# Patient Record
Sex: Male | Born: 1950 | Race: White | Hispanic: No | Marital: Married | Smoking: Current every day smoker
Health system: Southern US, Community
[De-identification: ages and names within clinical notes are randomized; demographics above are authoritative.]

## PROBLEM LIST (undated history)

## (undated) DIAGNOSIS — E119 Type 2 diabetes mellitus without complications: Secondary | ICD-10-CM

## (undated) DIAGNOSIS — Z515 Encounter for palliative care: Secondary | ICD-10-CM

## (undated) DIAGNOSIS — J449 Chronic obstructive pulmonary disease, unspecified: Secondary | ICD-10-CM

## (undated) DIAGNOSIS — C801 Malignant (primary) neoplasm, unspecified: Secondary | ICD-10-CM

---

## 2000-01-12 ENCOUNTER — Encounter: Payer: Self-pay | Admitting: Emergency Medicine

## 2000-01-12 ENCOUNTER — Emergency Department (HOSPITAL_COMMUNITY): Admission: EM | Admit: 2000-01-12 | Discharge: 2000-01-12 | Payer: Self-pay | Admitting: Emergency Medicine

## 2000-05-11 ENCOUNTER — Encounter: Payer: Self-pay | Admitting: Emergency Medicine

## 2000-05-11 ENCOUNTER — Inpatient Hospital Stay (HOSPITAL_COMMUNITY): Admission: EM | Admit: 2000-05-11 | Discharge: 2000-05-13 | Payer: Self-pay | Admitting: Emergency Medicine

## 2000-05-12 ENCOUNTER — Encounter: Payer: Self-pay | Admitting: Internal Medicine

## 2000-05-22 ENCOUNTER — Emergency Department (HOSPITAL_COMMUNITY): Admission: EM | Admit: 2000-05-22 | Discharge: 2000-05-22 | Payer: Self-pay | Admitting: Emergency Medicine

## 2000-05-26 ENCOUNTER — Ambulatory Visit (HOSPITAL_BASED_OUTPATIENT_CLINIC_OR_DEPARTMENT_OTHER): Admission: RE | Admit: 2000-05-26 | Discharge: 2000-05-26 | Payer: Self-pay | Admitting: *Deleted

## 2005-12-26 ENCOUNTER — Ambulatory Visit (HOSPITAL_BASED_OUTPATIENT_CLINIC_OR_DEPARTMENT_OTHER): Admission: RE | Admit: 2005-12-26 | Discharge: 2005-12-26 | Payer: Self-pay | Admitting: Family Medicine

## 2010-02-27 ENCOUNTER — Other Ambulatory Visit: Payer: Self-pay | Admitting: Urology

## 2010-02-27 DIAGNOSIS — R310 Gross hematuria: Secondary | ICD-10-CM

## 2010-03-02 ENCOUNTER — Ambulatory Visit
Admission: RE | Admit: 2010-03-02 | Discharge: 2010-03-02 | Disposition: A | Discharge status: Home / Self Care | Payer: No Typology Code available for payment source | Source: Ambulatory Visit | Attending: Urology | Admitting: Urology

## 2010-03-02 DIAGNOSIS — R310 Gross hematuria: Secondary | ICD-10-CM

## 2010-03-02 MED ORDER — IOHEXOL 300 MG/ML  SOLN
150.0000 mL | Freq: Once | INTRAMUSCULAR | Status: AC | PRN
  Administered 2010-03-02: 150 mL via INTRAVENOUS

## 2010-03-19 ENCOUNTER — Other Ambulatory Visit: Payer: Self-pay | Admitting: Urology

## 2010-03-19 ENCOUNTER — Ambulatory Visit (HOSPITAL_COMMUNITY)
Admission: RE | Admit: 2010-03-19 | Discharge: 2010-03-19 | Disposition: A | Discharge status: Home / Self Care | Payer: Medicare Other | Source: Ambulatory Visit | Attending: Urology | Admitting: Urology

## 2010-03-19 ENCOUNTER — Encounter (HOSPITAL_COMMUNITY): Payer: Medicare Other

## 2010-03-19 ENCOUNTER — Other Ambulatory Visit (HOSPITAL_COMMUNITY): Payer: Self-pay | Admitting: Urology

## 2010-03-19 DIAGNOSIS — I4949 Other premature depolarization: Secondary | ICD-10-CM | POA: Insufficient documentation

## 2010-03-19 DIAGNOSIS — Z01812 Encounter for preprocedural laboratory examination: Secondary | ICD-10-CM | POA: Insufficient documentation

## 2010-03-19 DIAGNOSIS — Z01818 Encounter for other preprocedural examination: Secondary | ICD-10-CM | POA: Insufficient documentation

## 2010-03-19 DIAGNOSIS — Z01810 Encounter for preprocedural cardiovascular examination: Secondary | ICD-10-CM | POA: Insufficient documentation

## 2010-03-19 LAB — BASIC METABOLIC PANEL
BUN: 10 mg/dL (ref 6–23)
CO2: 29 mEq/L (ref 19–32)
Calcium: 9.1 mg/dL (ref 8.4–10.5)
Chloride: 100 mEq/L (ref 96–112)
Creatinine, Ser: 0.81 mg/dL (ref 0.4–1.5)
GFR calc Af Amer: >60 mL/min (ref >60)
GFR calc non Af Amer: >60 mL/min (ref >60)
Glucose, Bld: 131 mg/dL — ABNORMAL HIGH (ref 70–99)
Potassium: 3.9 mEq/L (ref 3.5–5.1)
Sodium: 137 mEq/L (ref 135–145)

## 2010-03-19 LAB — CBC
HCT: 44.2 % (ref 39.0–52.0)
Hemoglobin: 14.6 g/dL (ref 13.0–17.0)
MCH: 28.1 pg (ref 26.0–34.0)
MCHC: 33 g/dL (ref 30.0–36.0)
MCV: 85.2 fL (ref 78.0–100.0)
Platelets: 243 10*3/uL (ref 150–400)
RBC: 5.19 MIL/uL (ref 4.22–5.81)
RDW: 14.1 % (ref 11.5–15.5)
WBC: 13.9 10*3/uL — ABNORMAL HIGH (ref 4.0–10.5)

## 2010-03-19 LAB — SURGICAL PCR SCREEN
MRSA, PCR: NEGATIVE
Staphylococcus aureus: NEGATIVE

## 2010-03-23 ENCOUNTER — Other Ambulatory Visit: Payer: Self-pay | Admitting: Urology

## 2010-03-23 ENCOUNTER — Ambulatory Visit (HOSPITAL_COMMUNITY)
Admission: RE | Admit: 2010-03-23 | Discharge: 2010-03-23 | Disposition: A | Discharge status: Home / Self Care | Payer: Medicare Other | Source: Ambulatory Visit | Attending: Urology | Admitting: Urology

## 2010-03-23 DIAGNOSIS — D090 Carcinoma in situ of bladder: Secondary | ICD-10-CM | POA: Insufficient documentation

## 2010-03-23 DIAGNOSIS — G4733 Obstructive sleep apnea (adult) (pediatric): Secondary | ICD-10-CM | POA: Insufficient documentation

## 2010-03-23 DIAGNOSIS — Z79899 Other long term (current) drug therapy: Secondary | ICD-10-CM | POA: Insufficient documentation

## 2010-03-23 DIAGNOSIS — N35919 Unspecified urethral stricture, male, unspecified site: Secondary | ICD-10-CM | POA: Insufficient documentation

## 2010-03-23 DIAGNOSIS — K219 Gastro-esophageal reflux disease without esophagitis: Secondary | ICD-10-CM | POA: Insufficient documentation

## 2010-03-23 NOTE — Op Note (Signed)
[name], [name] NO.:  [id number]  MEDICAL RECORD NO.:  [id number]           PATIENT TYPE:  O  LOCATION:  DAYL                         FACILITY:  [facility]  PHYSICIAN:  Yolander Goodie C. Vernie Ammons, M.D.  DATE OF BIRTH:  07-11-1950  DATE OF PROCEDURE:  03/23/2010 DATE OF DISCHARGE:                              OPERATIVE REPORT   PREOPERATIVE DIAGNOSIS:  Bladder tumor.  POSTOPERATIVE DIAGNOSIS:  1. Bladder tumor.                           2. Fossa navicularis stricture/stenosis.   PROCEDURE: 1. Transurethral resection of bladder tumor ( 2.6cm). 2. Bladder biopsy. 3. Urethral dilatation.  SURGEON:  Rachid Parham C. Vernie Ammons, M.D.  ANESTHESIA:  General.  DRAINS:  A 24-French Foley catheter.  BLOOD LOSS:  Minimal.  SPECIMENS: 1. Cold cup biopsy of trigone. 2. Bladder tumor.  COMPLICATIONS:  None.  INDICATIONS:  The patient is a 60 year old male who experienced gross hematuria.  He was evaluated with upper tract studies which revealed no abnormality.  Cystoscopically, I noted a bladder tumor on the left wall of his bladder and we therefore discussed treatment options and he has elected to proceed with transurethral resection of his bladder tumor. The risks, complications and alternatives have been discussed.  DESCRIPTION OF OPERATION:  After informed consent, the patient was brought to the major OR, placed on the table and administered general anesthesia.  The case was exceedingly difficult due to his massive size which necessitated strapping his large panniculus in a cephalad direction in order to gain access to the genitalia.  The genitalia was then sterilely prepped and draped and an official time-out was then performed.  Latex precautions were used as well.  Initially I attempted to pass the 26-French resectoscope but was unable due to stenosis in the area of the fossa navicularis.  I therefore used [employer] sounds to dilate this area up to 30-French.  I then was  able to pass the 26-French resectoscope with [employer] obturator into the bladder, removing the obturator I inserted the 12-degree lens with resectoscope element.  I first noted the bladder appeared to be lined with normal-appearing pink mucosa.  Ureteral orifices were of normal configuration and position.  The bladder tumor was identified on the left wall of the bladder lateral to the left ureteral orifice.  It did not appear to involve the orifice.  A full and systematic inspection of the bladder was undertaken.  Pressure on the bladder could not be performed due to his body habitus and therefore I removed the 12-degrees lens and inserted the 70-degrees lens which afforded me the ability to visualize the dome and anterior wall of the bladder as well as bladder neck region.  No other bladder tumors were identified other than the single tumor seen previously.  The 12-degrees lens with resectoscope element was then reinserted and I resected the bladder tumor.  I then removed all bladder tumor portions from the bladder using the [facility] syringe.  I then inserted the cold cup biopsy forceps and obtained cold cup biopsy from  the trigone just medial to the ureteral orifice.  This was sent as a separate specimen.  I then reinserted the resectoscope element and fulgurated this location as well as made sure there was no bleeding from the area of resection. Visualization revealed no bladder perforation.  I therefore drained the bladder and removed the resectoscope.  The urethral dilatation did result in bleeding from the fossa navicularis region.  I therefore placed at 24-French Foley catheter which allowed this area to be tamponaded and controlled any further bleeding.  The patient was then awakened and taken to recovery room in stable and satisfactory condition.  He tolerated the procedure well. There were no intraoperative complications.  He will be given written discharge instructions as  well as followup appointment and return to discuss his pathology results.     [name] C. Vernie Ammons, M.D.     MCO/MEDQ  D:  03/23/2010  T:  03/23/2010  Job:  [id number]  Electronically Signed by Ihor Gully M.D. on 03/23/2010 01:50:34 PM

## 2012-02-23 IMAGING — CR DG CHEST 2V
2 series · 2 of 2 positions shown · non-contrast
Comparison: None.

CLINICAL DATA: 59-year-old male preoperative study for bladder
tumor.

CHEST - 2 VIEW

[w chest pa *]
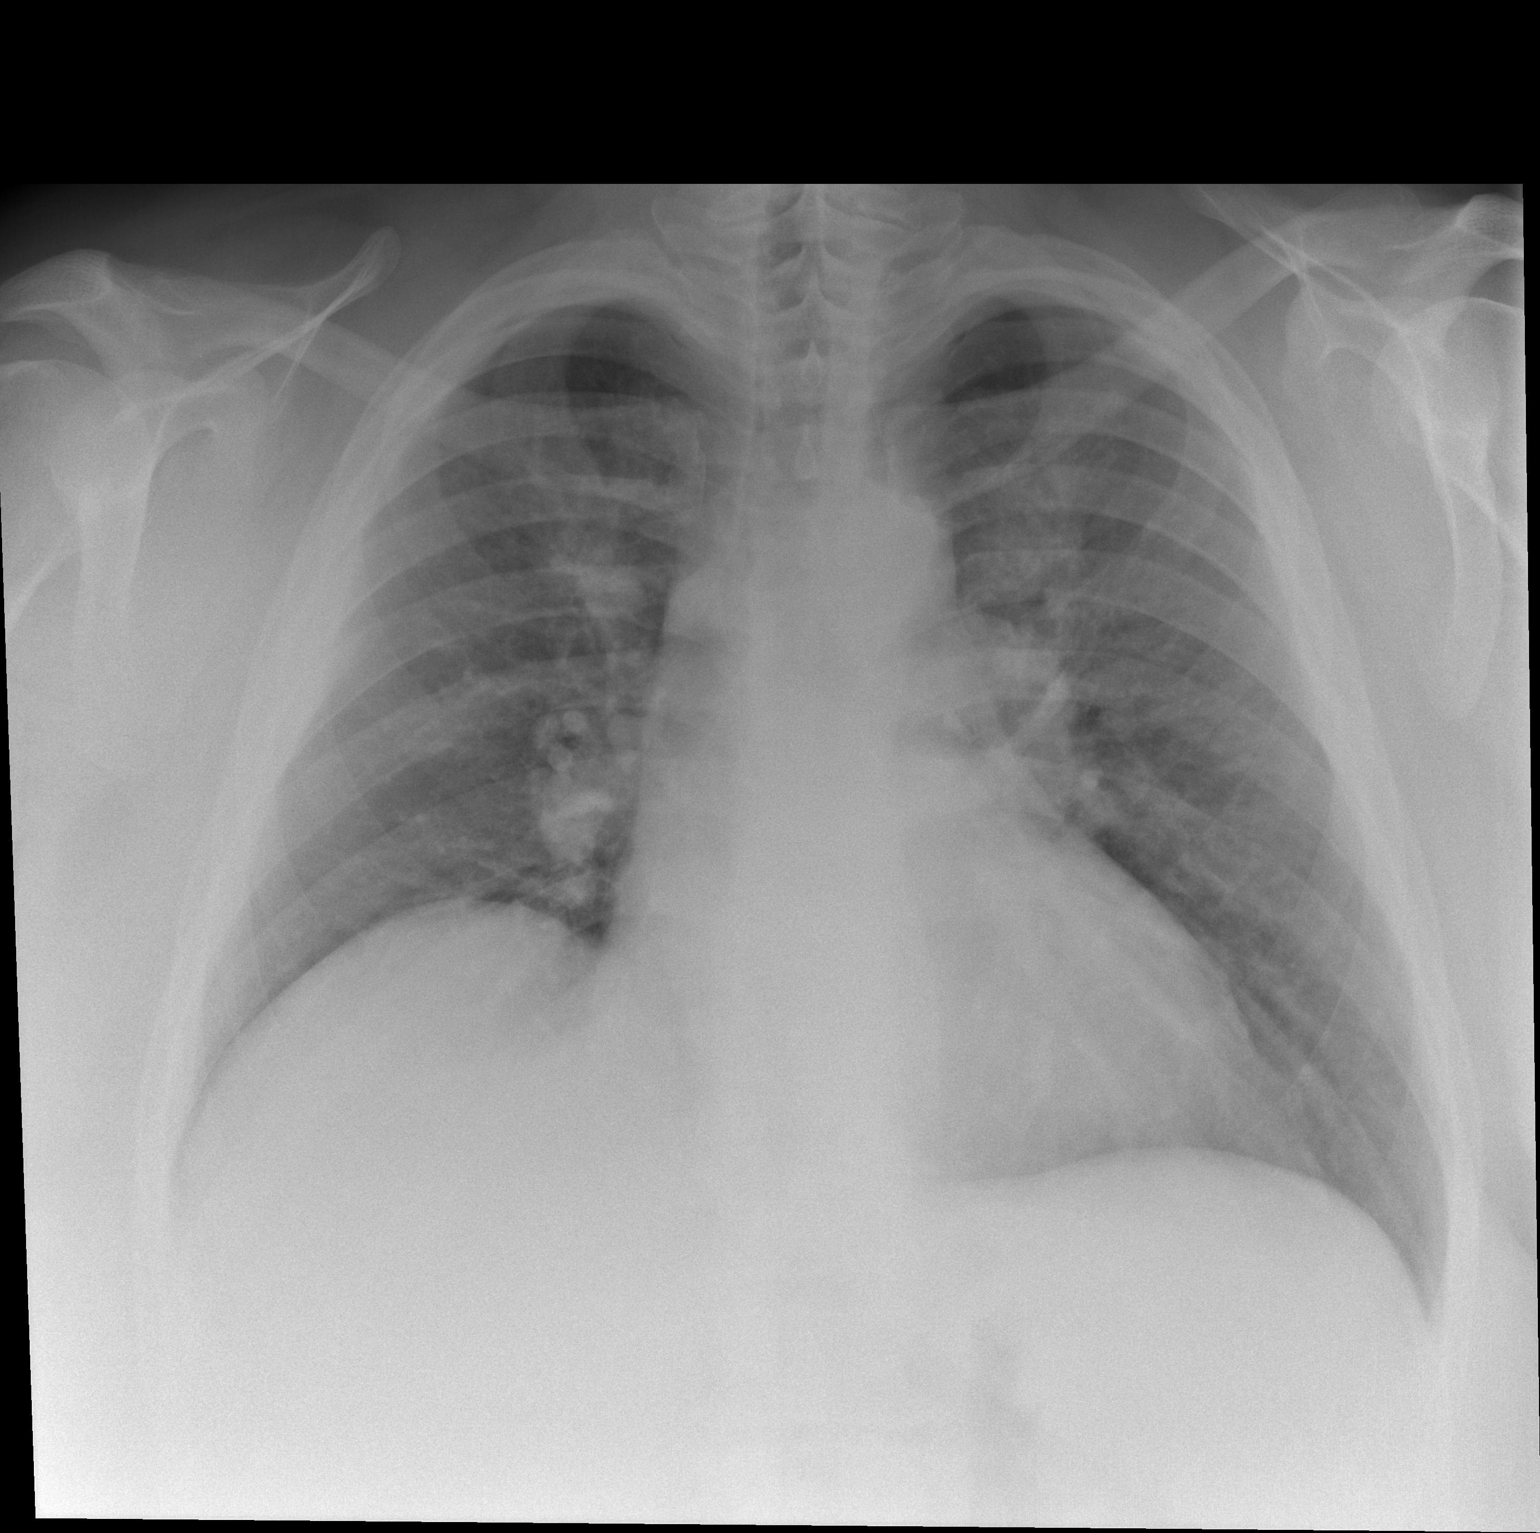

[w chest lat *]
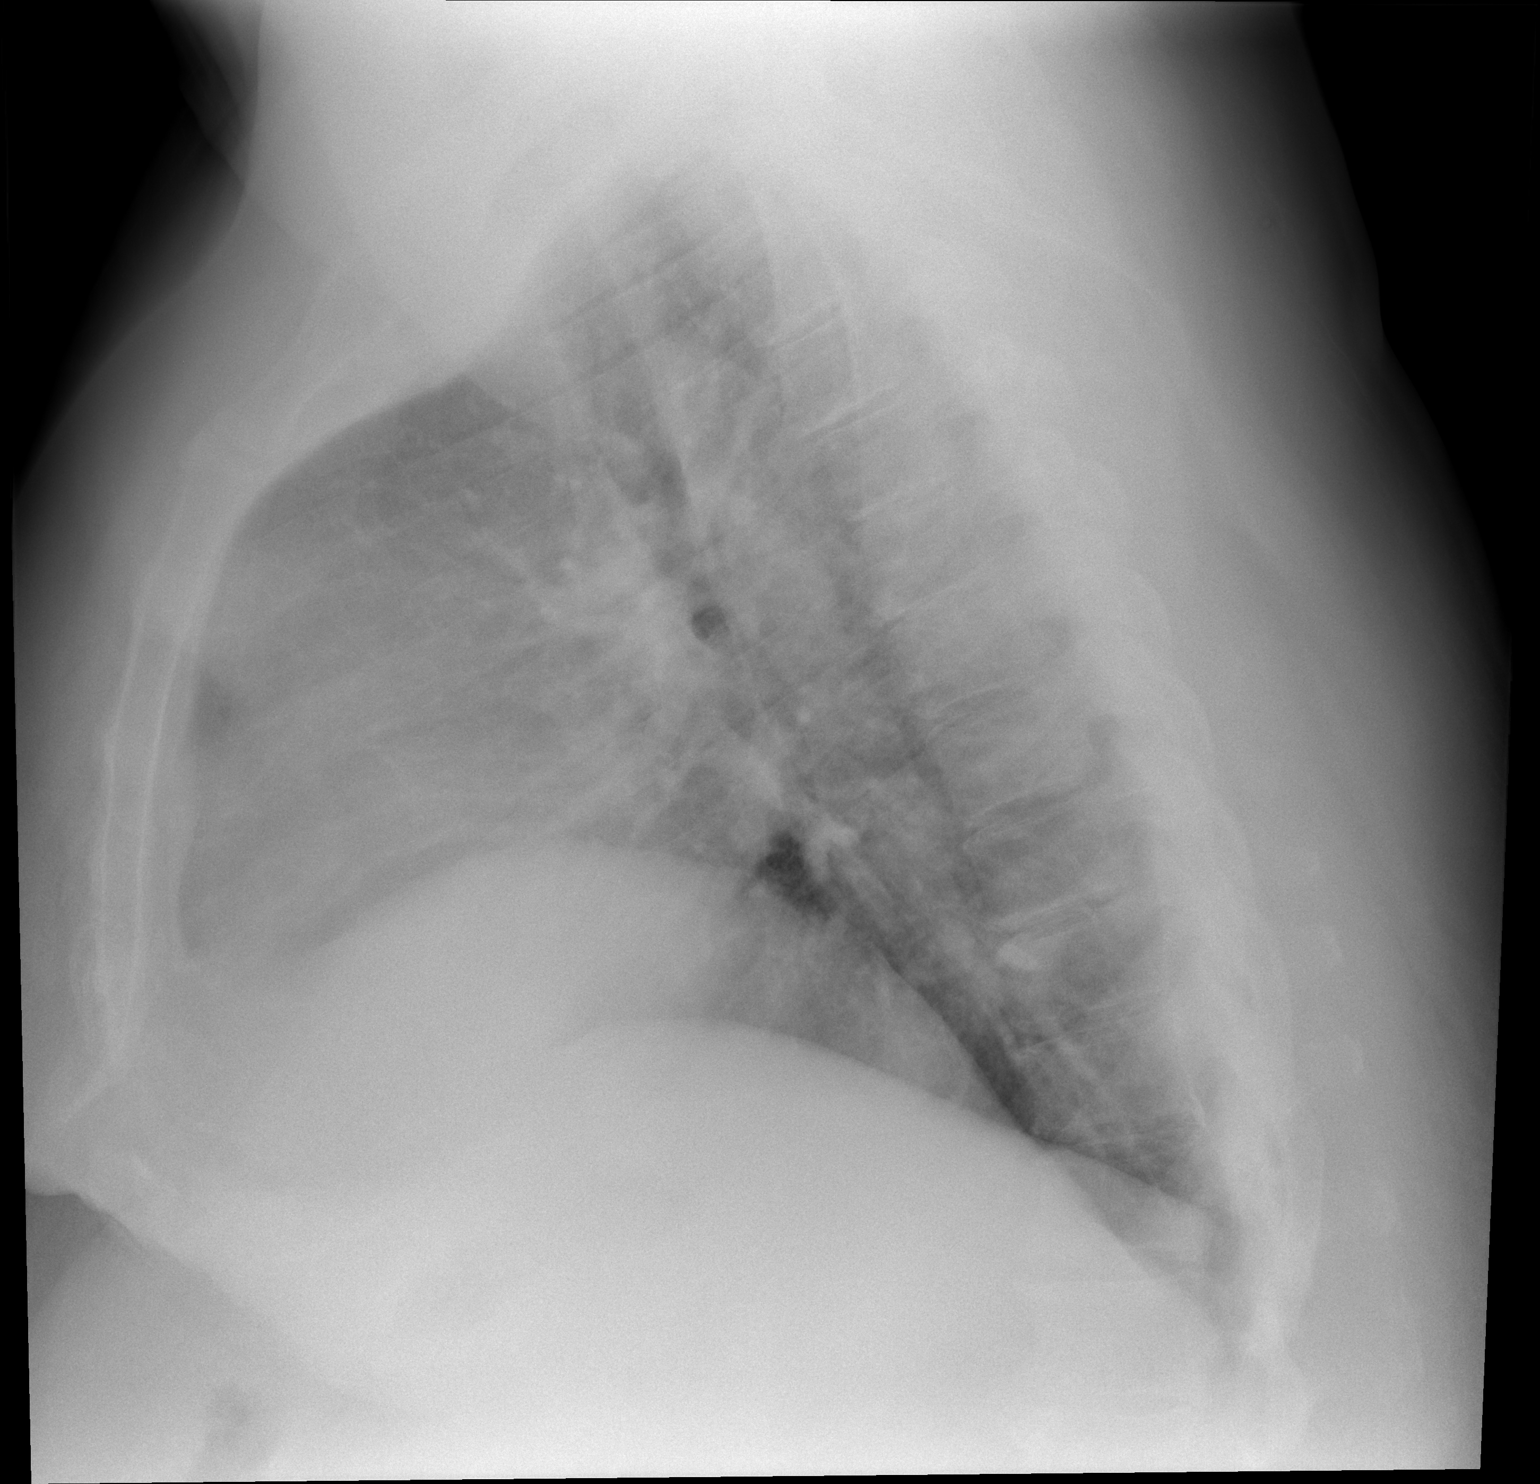

[2 of 2 positions shown; findings below may reference images not displayed]

FINDINGS: Elevated right hemidiaphragm.  Mild cardiomegaly. Other
mediastinal contours are within normal limits.  No pneumothorax,
pulmonary edema, pleural effusion or confluent pulmonary opacity.
No pulmonary nodule or mass. No acute osseous abnormality
identified.
IMPRESSION: Mild cardiomegaly.  Elevated right hemidiaphragm. No acute
cardiopulmonary abnormality.

## 2012-04-20 ENCOUNTER — Emergency Department (HOSPITAL_COMMUNITY): Payer: Medicare Other

## 2012-04-20 ENCOUNTER — Inpatient Hospital Stay (HOSPITAL_COMMUNITY)
Admission: EM | Admit: 2012-04-20 | Discharge: 2012-04-25 | DRG: 436 | Disposition: A | Discharge status: Hospice | Payer: Medicare Other | Attending: Internal Medicine | Admitting: Internal Medicine

## 2012-04-20 ENCOUNTER — Encounter (HOSPITAL_COMMUNITY): Payer: Self-pay | Admitting: *Deleted

## 2012-04-20 DIAGNOSIS — R17 Unspecified jaundice: Secondary | ICD-10-CM | POA: Diagnosis present

## 2012-04-20 DIAGNOSIS — F411 Generalized anxiety disorder: Secondary | ICD-10-CM | POA: Diagnosis present

## 2012-04-20 DIAGNOSIS — L03319 Cellulitis of trunk, unspecified: Secondary | ICD-10-CM | POA: Diagnosis present

## 2012-04-20 DIAGNOSIS — Z794 Long term (current) use of insulin: Secondary | ICD-10-CM

## 2012-04-20 DIAGNOSIS — C259 Malignant neoplasm of pancreas, unspecified: Principal | ICD-10-CM | POA: Diagnosis present

## 2012-04-20 DIAGNOSIS — Z515 Encounter for palliative care: Secondary | ICD-10-CM

## 2012-04-20 DIAGNOSIS — L97209 Non-pressure chronic ulcer of unspecified calf with unspecified severity: Secondary | ICD-10-CM | POA: Diagnosis present

## 2012-04-20 DIAGNOSIS — K8689 Other specified diseases of pancreas: Secondary | ICD-10-CM

## 2012-04-20 DIAGNOSIS — I517 Cardiomegaly: Secondary | ICD-10-CM | POA: Diagnosis present

## 2012-04-20 DIAGNOSIS — E119 Type 2 diabetes mellitus without complications: Secondary | ICD-10-CM | POA: Diagnosis present

## 2012-04-20 DIAGNOSIS — C787 Secondary malignant neoplasm of liver and intrahepatic bile duct: Secondary | ICD-10-CM | POA: Diagnosis present

## 2012-04-20 DIAGNOSIS — R1011 Right upper quadrant pain: Secondary | ICD-10-CM

## 2012-04-20 DIAGNOSIS — J449 Chronic obstructive pulmonary disease, unspecified: Secondary | ICD-10-CM

## 2012-04-20 DIAGNOSIS — G4730 Sleep apnea, unspecified: Secondary | ICD-10-CM | POA: Diagnosis present

## 2012-04-20 DIAGNOSIS — J4489 Other specified chronic obstructive pulmonary disease: Secondary | ICD-10-CM | POA: Diagnosis present

## 2012-04-20 DIAGNOSIS — K8020 Calculus of gallbladder without cholecystitis without obstruction: Secondary | ICD-10-CM

## 2012-04-20 DIAGNOSIS — I872 Venous insufficiency (chronic) (peripheral): Secondary | ICD-10-CM | POA: Diagnosis present

## 2012-04-20 DIAGNOSIS — I890 Lymphedema, not elsewhere classified: Secondary | ICD-10-CM | POA: Diagnosis present

## 2012-04-20 DIAGNOSIS — Z6841 Body Mass Index (BMI) 40.0 and over, adult: Secondary | ICD-10-CM

## 2012-04-20 DIAGNOSIS — L02219 Cutaneous abscess of trunk, unspecified: Secondary | ICD-10-CM | POA: Diagnosis present

## 2012-04-20 DIAGNOSIS — R109 Unspecified abdominal pain: Secondary | ICD-10-CM | POA: Diagnosis present

## 2012-04-20 DIAGNOSIS — Z8551 Personal history of malignant neoplasm of bladder: Secondary | ICD-10-CM

## 2012-04-20 DIAGNOSIS — L97809 Non-pressure chronic ulcer of other part of unspecified lower leg with unspecified severity: Secondary | ICD-10-CM | POA: Diagnosis present

## 2012-04-20 HISTORY — DX: Malignant (primary) neoplasm, unspecified: C80.1

## 2012-04-20 HISTORY — DX: Type 2 diabetes mellitus without complications: E11.9

## 2012-04-20 HISTORY — DX: Chronic obstructive pulmonary disease, unspecified: J44.9

## 2012-04-20 HISTORY — DX: Encounter for palliative care: Z51.5

## 2012-04-20 LAB — COMPREHENSIVE METABOLIC PANEL
ALT: 132 U/L — ABNORMAL HIGH (ref 0–53)
AST: 143 U/L — ABNORMAL HIGH (ref 0–37)
Alkaline Phosphatase: 302 U/L — ABNORMAL HIGH (ref 39–117)
CO2: 26 mEq/L (ref 19–32)
Chloride: 96 mEq/L (ref 96–112)
GFR calc non Af Amer: >90 mL/min (ref >90)
Potassium: 3.6 mEq/L (ref 3.5–5.1)
Sodium: 134 mEq/L — ABNORMAL LOW (ref 135–145)
Total Bilirubin: 5.2 mg/dL — ABNORMAL HIGH (ref 0.3–1.2)

## 2012-04-20 LAB — CBC WITH DIFFERENTIAL/PLATELET
Basophils Absolute: 0.1 10*3/uL (ref 0.0–0.1)
HCT: 38.2 % — ABNORMAL LOW (ref 39.0–52.0)
Lymphocytes Relative: 14 % (ref 12–46)
Monocytes Absolute: 1.4 10*3/uL — ABNORMAL HIGH (ref 0.1–1.0)
Neutro Abs: 7.9 10*3/uL — ABNORMAL HIGH (ref 1.7–7.7)
Neutrophils Relative %: 70 % (ref 43–77)
Platelets: 201 10*3/uL (ref 150–400)
RDW: 15.1 % (ref 11.5–15.5)
WBC: 11.3 10*3/uL — ABNORMAL HIGH (ref 4.0–10.5)

## 2012-04-20 MED ORDER — INSULIN DETEMIR 100 UNIT/ML ~~LOC~~ SOLN
20.0000 [IU] | Freq: Every evening | SUBCUTANEOUS | Status: DC
  Administered 2012-04-20: 20 [IU] via SUBCUTANEOUS
  Filled 2012-04-20: qty 0.2

## 2012-04-20 MED ORDER — IOHEXOL 300 MG/ML  SOLN
125.0000 mL | Freq: Once | INTRAMUSCULAR | Status: AC | PRN
  Administered 2012-04-20: 125 mL via INTRAVENOUS

## 2012-04-20 MED ORDER — KETOCONAZOLE 2 % EX CREA
1.0000 "application " | TOPICAL_CREAM | Freq: Every day | CUTANEOUS | Status: DC
  Administered 2012-04-21 – 2012-04-25 (×5): 1 via TOPICAL
  Filled 2012-04-20: qty 15

## 2012-04-20 MED ORDER — PENICILLIN V POTASSIUM 500 MG PO TABS
500.0000 mg | ORAL_TABLET | Freq: Three times a day (TID) | ORAL | Status: DC
  Administered 2012-04-20 – 2012-04-25 (×14): 500 mg via ORAL
  Filled 2012-04-20 (×18): qty 1

## 2012-04-20 MED ORDER — POLYETHYLENE GLYCOL 3350 17 G PO PACK
17.0000 g | PACK | Freq: Every day | ORAL | Status: DC
  Administered 2012-04-20 – 2012-04-25 (×6): 17 g via ORAL
  Filled 2012-04-20 (×6): qty 1

## 2012-04-20 MED ORDER — POTASSIUM CHLORIDE CRYS ER 10 MEQ PO TBCR
10.0000 meq | EXTENDED_RELEASE_TABLET | Freq: Every day | ORAL | Status: DC
  Administered 2012-04-20 – 2012-04-25 (×6): 10 meq via ORAL
  Filled 2012-04-20 (×7): qty 1

## 2012-04-20 MED ORDER — ALBUTEROL SULFATE (5 MG/ML) 0.5% IN NEBU
2.5000 mg | INHALATION_SOLUTION | RESPIRATORY_TRACT | Status: DC | PRN
  Administered 2012-04-24: 2.5 mg via RESPIRATORY_TRACT
  Filled 2012-04-20: qty 0.5

## 2012-04-20 MED ORDER — ZINC OXIDE 11.3 % EX CREA
1.0000 "application " | TOPICAL_CREAM | Freq: Two times a day (BID) | CUTANEOUS | Status: DC
  Administered 2012-04-20 – 2012-04-25 (×9): 1 via TOPICAL
  Filled 2012-04-20: qty 56

## 2012-04-20 MED ORDER — SODIUM CHLORIDE 0.9 % IV BOLUS (SEPSIS)
1000.0000 mL | Freq: Once | INTRAVENOUS | Status: AC
  Administered 2012-04-20: 1000 mL via INTRAVENOUS

## 2012-04-20 MED ORDER — POLYETHYLENE GLYCOL 3350 17 G PO PACK
17.0000 g | PACK | Freq: Every day | ORAL | Status: DC | PRN
  Filled 2012-04-20: qty 1

## 2012-04-20 MED ORDER — HYDROMORPHONE HCL PF 1 MG/ML IJ SOLN
1.0000 mg | Freq: Once | INTRAMUSCULAR | Status: AC
  Administered 2012-04-20: 1 mg via INTRAVENOUS
  Filled 2012-04-20: qty 1

## 2012-04-20 MED ORDER — INSULIN ASPART 100 UNIT/ML ~~LOC~~ SOLN
20.0000 [IU] | Freq: Three times a day (TID) | SUBCUTANEOUS | Status: DC
  Administered 2012-04-21 – 2012-04-25 (×11): 20 [IU] via SUBCUTANEOUS

## 2012-04-20 MED ORDER — IOHEXOL 300 MG/ML  SOLN
50.0000 mL | Freq: Once | INTRAMUSCULAR | Status: AC | PRN
  Administered 2012-04-20: 50 mL via ORAL

## 2012-04-20 MED ORDER — ALLOPURINOL 300 MG PO TABS
300.0000 mg | ORAL_TABLET | Freq: Every day | ORAL | Status: DC
  Administered 2012-04-20 – 2012-04-25 (×6): 300 mg via ORAL
  Filled 2012-04-20 (×7): qty 1

## 2012-04-20 MED ORDER — ALUM & MAG HYDROXIDE-SIMETH 200-200-20 MG/5ML PO SUSP: 30.0000 mL | Freq: Four times a day (QID) | ORAL | Status: DC | PRN

## 2012-04-20 MED ORDER — HYDROCODONE-ACETAMINOPHEN 10-325 MG PO TABS
1.0000 | ORAL_TABLET | Freq: Three times a day (TID) | ORAL | Status: DC
  Administered 2012-04-20 – 2012-04-23 (×9): 1 via ORAL
  Filled 2012-04-20 (×9): qty 1

## 2012-04-20 MED ORDER — HEPARIN SODIUM (PORCINE) 5000 UNIT/ML IJ SOLN
5000.0000 [IU] | Freq: Three times a day (TID) | INTRAMUSCULAR | Status: DC
  Administered 2012-04-20 – 2012-04-25 (×12): 5000 [IU] via SUBCUTANEOUS
  Filled 2012-04-20 (×17): qty 1

## 2012-04-20 MED ORDER — ONDANSETRON HCL 4 MG PO TABS: 4.0000 mg | ORAL_TABLET | Freq: Four times a day (QID) | ORAL | Status: DC | PRN

## 2012-04-20 MED ORDER — FUROSEMIDE 80 MG PO TABS
80.0000 mg | ORAL_TABLET | Freq: Every day | ORAL | Status: DC
  Administered 2012-04-20 – 2012-04-25 (×6): 80 mg via ORAL
  Filled 2012-04-20 (×6): qty 1

## 2012-04-20 MED ORDER — INSULIN ASPART 100 UNIT/ML ~~LOC~~ SOLN
0.0000 [IU] | Freq: Three times a day (TID) | SUBCUTANEOUS | Status: DC
  Administered 2012-04-21 – 2012-04-22 (×2): 1 [IU] via SUBCUTANEOUS
  Administered 2012-04-25: 0 [IU] via SUBCUTANEOUS
  Administered 2012-04-25: 1 [IU] via SUBCUTANEOUS

## 2012-04-20 MED ORDER — ONDANSETRON HCL 4 MG/2ML IJ SOLN
4.0000 mg | Freq: Four times a day (QID) | INTRAMUSCULAR | Status: DC | PRN
  Administered 2012-04-23: 4 mg via INTRAVENOUS
  Filled 2012-04-20: qty 2

## 2012-04-20 MED ORDER — GUAIFENESIN-DM 100-10 MG/5ML PO SYRP: 5.0000 mL | ORAL_SOLUTION | ORAL | Status: DC | PRN

## 2012-04-20 MED ORDER — ONDANSETRON HCL 4 MG/2ML IJ SOLN
4.0000 mg | Freq: Once | INTRAMUSCULAR | Status: AC
  Administered 2012-04-20: 4 mg via INTRAVENOUS
  Filled 2012-04-20: qty 2

## 2012-04-20 MED ORDER — MORPHINE SULFATE 2 MG/ML IJ SOLN
2.0000 mg | INTRAMUSCULAR | Status: DC | PRN
  Administered 2012-04-20 – 2012-04-23 (×9): 2 mg via INTRAVENOUS
  Filled 2012-04-20 (×9): qty 1

## 2012-04-20 MED ORDER — ALPRAZOLAM 1 MG PO TABS
3.0000 mg | ORAL_TABLET | Freq: Every evening | ORAL | Status: DC | PRN
  Administered 2012-04-20 – 2012-04-24 (×5): 3 mg via ORAL
  Filled 2012-04-20 (×5): qty 3

## 2012-04-20 NOTE — ED Notes (Signed)
[id number]<YN> Expected date:<BR> Expected time:<BR> Means of arrival:<BR> Comments:<BR> Ems/ elderly

## 2012-04-20 NOTE — ED Provider Notes (Signed)
Patitent reports he has had chronic abdominal pain for years. He reports it's getting to the point where he can't stand it anymore. He indicates the pain is in the right side and left side of his abdomen. Patient is obese. His neck is wearing oxygen.   Medical screening examination/treatment/procedure(s) were conducted as a shared visit with non-physician practitioner(s) and myself.  I personally evaluated the patient during the encounter  Devoria Albe, MD, Franz Dell, MD 04/20/12 [phone]

## 2012-04-20 NOTE — H&P (Signed)
[facility]                                                                                    Patient Demographics  [name], is a 62 y.o. male  CSN: [id number]  MRN: [id number]  DOB - 1950-04-14  Admit Date - 04/20/2012  Outpatient Primary MD for the patient is Aida Puffer, MD   With History of -  Past Medical History  Diagnosis Date  . Diabetes mellitus without complication   . COPD (chronic obstructive pulmonary disease)   . Cancer     bladder      History reviewed. No pertinent past surgical history.  in for   Chief Complaint  Patient presents with  . Abdominal Pain     HPI  [name]  is a 62 y.o. male, with history of morbid obesity, diabetes mellitus type 2 now insulin-dependent, COPD on home oxygen, history of bladder cancer in the past, who has had chronic abdominal pain for the last 2-3 years which is been getting worse for the last 3 months, also describes 120 pounds of unintentional weight loss in the last 3 months, patient also says that about 5 days ago he suffered a trip and fall at home and hurt his right upper quadrant and since then his epigastric abdominal pain and right upper quadrant abdominal pain has been worse, she describes pain as sharp with at times radiating to the back, worse with food better with bowel rest and pain medications, no associated symptoms.   In the ER workup suggestive of pancreatic mass on CT scan, transaminitis with elevated total bilirubin, I was asked to admit the patient for possible pancreatic cancer with metastases to the liver.    Review of Systems    In addition to the HPI above,  No Fever-chills, No Headache, No changes with Vision or hearing, No problems swallowing food or Liquids, No Chest pain, Cough , chronic Shortness of Breath has home oxygen, Positive Abdominal pain, No Nausea or Vommitting, Bowel movements are regular, No Blood in stool or Urine, No dysuria, No new skin rashes  or bruises, No new joints pains-aches,  No new weakness, tingling, numbness in any extremity, No recent weight gain ,  120 pound unintentional weight loss in 3 months No polyuria, polydypsia or polyphagia, No significant Mental Stressors.  A full 10 point Review of Systems was done, except as stated above, all other Review of Systems were negative.   Social History History  Substance Use Topics  . Smoking status: Current Every Day Smoker    Types: Cigarettes  . Smokeless tobacco: Not on file  . Alcohol Use: No      Family History  pancreatitis in father and sister, bladder cancer in patient's father  Prior to Admission medications   Medication Sig Start Date End Date Taking? Authorizing Provider  allopurinol (ZYLOPRIM) 300 MG tablet Take 300 mg by mouth daily.   Yes Historical Provider, MD  ALPRAZolam Prudy Feeler) 1 MG tablet Take 3 mg by mouth at bedtime as needed for sleep.   Yes Historical Provider, MD  furosemide (LASIX) 80 MG tablet Take 80 mg by mouth daily.  Yes Historical Provider, MD  HYDROcodone-acetaminophen (NORCO) 10-325 MG per tablet Take 1 tablet by mouth 3 (three) times daily.   Yes Historical Provider, MD  insulin aspart (NOVOLOG) 100 UNIT/ML injection Inject 20 Units into the skin 3 (three) times daily before meals.   Yes Historical Provider, MD  insulin detemir (LEVEMIR) 100 UNIT/ML injection Inject 20 Units into the skin every evening.   Yes Historical Provider, MD  ketoconazole (NIZORAL) 2 % cream Apply 1 application topically daily. Applies to right leg   Yes Historical Provider, MD  penicillin v potassium (VEETID) 500 MG tablet Take 500 mg by mouth 3 (three) times daily.   Yes Historical Provider, MD  polyethylene glycol (MIRALAX / GLYCOLAX) packet Take 17 g by mouth daily.   Yes Historical Provider, MD  potassium chloride (K-DUR,KLOR-CON) 10 MEQ tablet Take 10 mEq by mouth daily.   Yes Historical Provider, MD  zinc oxide (BALMEX) 11.3 % CREA cream Apply 1  application topically 2 (two) times daily. Applies to feet and legs   Yes Historical Provider, MD    Allergies  Allergen Reactions  . Epinephrine   . Keflex (Cephalexin)     Physical Exam  Vitals  Blood pressure 129/62, pulse 71, temperature 98.5 F (36.9 C), resp. rate 26, SpO2 99.00%.   1. General morbidly obese Caucasian male lying in bed in NAD,     2. Normal affect and insight, Not Suicidal or Homicidal, Awake Alert, Oriented X 3.  3. No F.N deficits, ALL C.Nerves Intact, Strength 5/5 all 4 extremities, Sensation intact all 4 extremities, Plantars down going.  4. Ears and Eyes appear Normal, Conjunctivae clear, PERRLA. Moist Oral Mucosa.  5. Supple Neck, No JVD, No cervical lymphadenopathy appriciated, No Carotid Bruits.  6. Symmetrical Chest wall movement, Good air movement bilaterally, CTAB.  7. RRR, No Gallops, Rubs or Murmurs, No Parasternal Heave.  8. Positive Bowel Sounds, Abdomen Soft, mild epigastric tenderness and right upper quadrant tenderness, No organomegaly appriciated,No rebound -guarding or rigidity.  9.  No Cyanosis, Normal Skin Turgor, No Skin Rash or Bruise. Chronic 2+ edema with lymphedema in the right leg, 3+ edema with a small venous stasis ulcer on the left, some cellulitis in the abdominal pannus. Mild icterus.  10. Good muscle tone,  joints appear normal , no effusions, Normal ROM.  11. No Palpable Lymph Nodes in Neck or Axillae     Data Review  CBC  Recent Labs Lab 04/20/12 1111  WBC 11.3*  HGB 12.7*  HCT 38.2*  PLT 201  MCV 84.9  MCH 28.2  MCHC 33.2  RDW 15.1  LYMPHSABS 1.6  MONOABS 1.4*  EOSABS 0.4  BASOSABS 0.1   ------------------------------------------------------------------------------------------------------------------  Chemistries   Recent Labs Lab 04/20/12 1111  NA 134*  K 3.6  CL 96  CO2 26  GLUCOSE 90  BUN 10  CREATININE 0.67  CALCIUM 9.2  AST 143*  ALT 132*  ALKPHOS 302*  BILITOT 5.2*    ------------------------------------------------------------------------------------------------------------------ CrCl is unknown because there is no height on file for the current visit. ------------------------------------------------------------------------------------------------------------------ No results found for this basename: TSH, T4TOTAL, FREET3, T3FREE, THYROIDAB,  in the last 72 hours   Coagulation profile No results found for this basename: INR, PROTIME,  in the last 168 hours ------------------------------------------------------------------------------------------------------------------- No results found for this basename: DDIMER,  in the last 72 hours -------------------------------------------------------------------------------------------------------------------  Cardiac Enzymes No results found for this basename: CK, CKMB, TROPONINI, MYOGLOBIN,  in the last 168 hours ------------------------------------------------------------------------------------------------------------------ No components found with this  basename: POCBNP,    ---------------------------------------------------------------------------------------------------------------  Urinalysis No results found for this basename: colorurine, appearanceur, labspec, phurine, glucoseu, hgbur, bilirubinur, ketonesur, proteinur, urobilinogen, nitrite, leukocytesur    ----------------------------------------------------------------------------------------------------------------  Imaging results:   Dg Ribs Unilateral W/chest Right  04/20/2012  *RADIOLOGY REPORT*  Clinical Data: Right lower rib pain, fell 1 week ago, shortness of breath  RIGHT RIBS AND CHEST - 3+ VIEW  Comparison: Chest x-ray of 03/19/2010  Findings: No active infiltrate or effusion is seen.  Mild cardiomegaly is stable.  Right rib detail films show no acute right rib fracture.  IMPRESSION:  1.  No active lung disease.  Mild cardiomegaly. 2.   Negative right rib detail.   Original Report Authenticated By: Dwyane Dee, M.D.    US Abdomen Complete  04/20/2012  *RADIOLOGY REPORT*  Clinical Data:  Abdominal pain, nausea and vomiting, morbid obesity, diabetes  COMPLETE ABDOMINAL ULTRASOUND  Comparison:  CT abdomen pelvis of 03/02/2010  Findings:  Gallbladder:  There is strong acoustical shadowing from the gallbladder consistent with multiple gallstones filling the gallbladder.  However there is no pain over gallbladder currently with compression.  Common bile duct:  The common bile duct is normal measuring 3.0 mm in diameter.  Liver:  The liver is somewhat inhomogeneous which may indicate mild fatty infiltration.  No focal abnormality is seen.  IVC:  Appears normal.  Pancreas:  The ultrasound technologist describes an inhomogeneous somewhat hypoechoic structure in the neck of the pancreas of 4.6 x 3.6 x 5.5 cm.  I did review the CT from February 2012 showing no abnormality.  Repeat CT of the abdomen using pancreatic protocol is recommended to evaluate for a possible pancreatic lesion.  Spleen:  The spleen is normal measuring 12.5 cm sagittally.  Right Kidney:  No hydronephrosis is seen.  The right kidney measures 12.0 cm sagittally.  Left Kidney:  No hydronephrosis is noted.  The left kidney measures 13.0 cm.  Abdominal aorta:  The abdominal aorta is obscured by bowel gas.  IMPRESSION:  1.  Multiple gallstones fill the gallbladder.  However there is no present evidence of acute cholecystitis by ultrasound. 2.  Hypoechoic rounded structure in the mid body - neck of the pancreas as described above.  Recommend CT of the abdomen using pancreatic protocol to evaluate further. 3.  Inhomogeneous liver may indicate mild fatty infiltration.   Original Report Authenticated By: Dwyane Dee, M.D.    Ct Abdomen Pelvis W Contrast  04/20/2012  *RADIOLOGY REPORT*  Clinical Data: Right-sided abdominal pain, nausea, known gallstones, history bladder carcinoma with treatment  in 2012  CT ABDOMEN AND PELVIS WITH CONTRAST  Technique:  Multidetector CT imaging of the abdomen and pelvis was performed following the standard protocol during bolus administration of intravenous contrast.  Contrast: 50mL OMNIPAQUE IOHEXOL 300 MG/ML  SOLN, OMNIPAQUE IOHEXOL 300 MG/ML  SOLN  Comparison: CT abdomen pelvis of 03/02/2010  Findings: The lung bases are clear.  However there are multiple poorly defined low attenuation lesions scattered diffusely throughout the liver which appear new, consistent with diffuse liver metastases.  One of the larger of these lesions is within the lateral segment of the left lobe of liver anteriorly measuring 25 mm in diameter.  The gallbladder is contracted and there are faintly calcified gallstones present as noted by ultrasound.  The pancreatic head is fatty infiltrated.  However, there is a solid appearing mass within the mid body of the pancreas measuring 3.6 x 4.7 cm consistent with pancreatic body carcinoma.  The tail  of the pancreas is somewhat atrophic and the pancreatic duct is not dilated.  The adrenal glands are unremarkable and the spleen remains slightly prominent.  There are multiple lymph nodes surrounding the pancreatic head with one of the larger nodes measuring 16 mm in short axis diameter consistent with metastatic disease.  Multiple mesenteric and retroperitoneal lymph nodes are present.  The kidneys enhance with no focal abnormality and no calculi are seen.  On delayed images, the pelvocaliceal systems are unremarkable.  The abdominal aorta is normal in caliber.  IMPRESSION: 1.  3.6 x 4.7 cm mass in the mid body of the pancreas consistent with pancreatic carcinoma.  2.  Multiple diffuse liver metastases.  3.  Peripancreatic as well as retroperitoneal and mesenteric adenopathy.   Original Report Authenticated By: Dwyane Dee, M.D.          Assessment & Plan     1. Acute on chronic epigastric and right upper quadrant abdominal pain-with CT  finding of pancreatic mass and a mass in the liver likely representing pancreatic cancer with metastases to the liver, also has transaminitis and elevated total bilirubin, 120 pound of unintentional weight loss. Patient will be admitted to the hospital, will check transaminases, we'll request IR to conduct ultrasound-guided liver biopsy, of note patient has history of bladder cancer in the past. Will order CA 19.9, pain control, once tissue biopsy done oncology will be consulted.   2. COPD on home oxygen. No acute issues no wheezing on exam, nebulizer treatments on a needed basis, oxygen to be continued at 2 L nasal cannula.   3. Pannus cellulitis, left lower leg venous stasis ulcer, massive lymphedema in the right lower extremity. For now home antibiotic oral, Along with nystatin cream will be continued. Local wound care. Monitor clinically.   4. Diabetes mellitus type 2. Now insulin-dependent, home dose insulin regimen will be continued, can't modify diet, check A1c, and sliding scale insulin for now only with meals.   5. Moderate recently outpatient followup PCP.   DVT Prophylaxis Heparin   AM Labs Ordered, also please review Full Orders  Family Communication: Admission, patients condition and plan of care including tests being ordered have been discussed with the patient and mother who indicate understanding and agree with the plan and Code Status.  Code Status Full  Likely DC to  Home  Time spent in minutes : 35  Condition GUARDED   Leroy Sea M.D on 04/20/2012 at 5:37 PM  Between 7am to 7pm - Pager - [phone]  After 7pm go to [email or web address] - password [email or web address]  And look for the night coverage person covering me after hours  [facility] Office  [phone]

## 2012-04-20 NOTE — ED Notes (Signed)
Per EMS, pt from home, pt reports R side abd pain with nausea, denies any vomiting.  Pt +gallstones.  Has been seeing PMD little.  Pt uses O2 at home 2L [place].

## 2012-04-20 NOTE — ED Notes (Signed)
Pt unable to  Urinate at present. Reports he usually voids on toilet.

## 2012-04-20 NOTE — ED Notes (Signed)
Pt remains in CT

## 2012-04-20 NOTE — ED Notes (Signed)
Chaplain responded to page from nursing.   Pt recently learned of metastatic cancer.  Introduced spiritual care as resource and provided emotional and spiritual support at bedside.  Pt's mother and sister present at bedside.  Pt related having been in pain for long time and expressed wanting his pain to stop - whether he were to remain alive or expire.  Pt's sister stated she was not ready for pt to expire.  Chaplain facilitated brief conversation around pt and family's hopes / desires / goals.  Pt expressed some distress around possibility of treatment, describing previous treatments for bladder cancer as making him very ill.    Chaplain will continue to follow for support during admission.  Please page as needs arise.   Belva Crome MDiv

## 2012-04-20 NOTE — ED Provider Notes (Signed)
See prior note   Ward Givens, MD 04/20/12 [phone]

## 2012-04-20 NOTE — ED Provider Notes (Signed)
History     CSN: [id number]  Arrival date & time 04/20/12  1053   First MD Initiated Contact with Patient 04/20/12 1054      Chief Complaint  Patient presents with  . Abdominal Pain    (Consider location/radiation/quality/duration/timing/severity/associated sxs/prior treatment) HPI  62 year old male with history of gallstones presents complaining of right abdominal pain. Patient was brought here via EMS. Patient reports he has had intermittent pain to his right side of the abdomen for the past year. Pain has been increasing in frequency and intensity for the past week. Describe pain as a sharp stabbing sensation, persistent, worsening with movement. Reports he had a mechanical fall a week ago when he tripped on his socks and landed on his R side of chest which has worsened his pain. However states he has abdominal pain prior to his fall. Pain is severe, keeping him awake at night. He has followup with his primary care Dr. and was given pain medication which has helped. He endorsed increased shortness of breath due to pain. He does use home O2. Denies increase in O2 usage. Patient denies fever, chills, productive cough, hemoptysis, back pain, dysuria, or rash. He is on a strict diet that is devoid of fatty content, therefore denies any significant pain related to eating. Does endorse nausea without vomiting. Does endorse loose stool without blood or mucus. He noticed that his urine has been very concentrated and dark recently. He has been taking MiraLAX as prescribed by his Dr. for the past week.  No past medical history on file.  No past surgical history on file.  No family history on file.  History  Substance Use Topics  . Smoking status: Not on file  . Smokeless tobacco: Not on file  . Alcohol Use: Not on file      Review of Systems  Constitutional:       A complete 10 system review of systems was obtained and all systems are negative except as noted in the HPI and PMH.     Allergies  Review of patient's allergies indicates not on file.  Home Medications  No current outpatient prescriptions on file.  There were no vitals taken for this visit.  Physical Exam  Nursing note and vitals reviewed. Constitutional: He appears well-developed and well-nourished. No distress.  Awake, alert, nontoxic appearance. Morbidly obese  HENT:  Head: Atraumatic.  Mouth/Throat: Oropharynx is clear and moist.  Eyes: Conjunctivae are normal. Right eye exhibits no discharge. Left eye exhibits no discharge.  Neck: Normal range of motion. Neck supple.  Cardiovascular: Normal rate and regular rhythm.   Pulmonary/Chest: Effort normal. No respiratory distress. He exhibits tenderness (Tenderness to right lower anterolateral aspects of chest wall without crepitus, emphysema, or deformity noted).  Abdominal: Soft. Bowel sounds are normal. There is tenderness (right upper quadrant abdominal tenderness on exam, positive Murphy sign, no McBurney's point. No guarding or rebound tenderness). There is no rebound.  Musculoskeletal: He exhibits edema (Evidence of venous stasis to bilateral lower extremities with 1+ pitting edema noted pedal pulse palpable). He exhibits no tenderness.  ROM appears intact, no obvious focal weakness  Neurological: He is alert.  Skin: Skin is warm and dry.  Psychiatric: He has a normal mood and affect.    ED Course  Procedures (including critical care time)  11:17 AM Patient presents with right upper quadrant, right lower ribs pain. Pain may be related to gallbladder etiology. Patient however did fell on the affected side which may account for his  persistent pain. Will obtain rib x-ray to rule out rib fracture. Will also obtain abdominal ultrasound to rule out cholecystitis. Labs ordered. Pain medication given.  1:38 PM Labs remarkable for an AST of 143, ALT of 132 and alkaline phosphatase of 302 and a total bili of 5.2 abdominal ultrasound shows evidence of  cholelithiasis without evidence of cholecystitis. However there is hypoechoic round structure in the mid body and neck the pancreas with recommendation for CT scan for further evaluation. Patient reports he was diagnosed with bladder cancer last year and undergo resection and chemotherapy. In light of this finding, we'll obtain a CT scan to rule out any new neoplasm. Patient voiced understanding and agrees with plan.  4:15 PM Abdominal and pelvis CT scan shows several masses noted in the body of the pancreas consistent with pancreatic carcinoma. There also multiple diffuse liver metastases, there is peri-pancreatic as well as retroperitoneal and mesenteric adenopathy. In light of this finding, can also considering that his liver and alkaline phosphatase are elevated I will consult for admission for further management. Care discussed with attending. Finding was discussed with patient voiced understanding and agrees with plan.  5:07 PM I have consulted with [facility], Dr. Bess Harvest, who agrees to admit pt for further management of new metastatic pancreatic cancer.    Labs Reviewed  CBC WITH DIFFERENTIAL - Abnormal; Notable for the following:    WBC 11.3 (*)    Hemoglobin 12.7 (*)    HCT 38.2 (*)    Neutro Abs 7.9 (*)    Monocytes Absolute 1.4 (*)    All other components within normal limits  COMPREHENSIVE METABOLIC PANEL - Abnormal; Notable for the following:    Sodium 134 (*)    Albumin 2.8 (*)    AST 143 (*)    ALT 132 (*)    Alkaline Phosphatase 302 (*)    Total Bilirubin 5.2 (*)    All other components within normal limits  LIPASE, BLOOD   Dg Ribs Unilateral W/chest Right  04/20/2012  *RADIOLOGY REPORT*  Clinical Data: Right lower rib pain, fell 1 week ago, shortness of breath  RIGHT RIBS AND CHEST - 3+ VIEW  Comparison: Chest x-ray of 03/19/2010  Findings: No active infiltrate or effusion is seen.  Mild cardiomegaly is stable.  Right rib detail films show no acute right rib  fracture.  IMPRESSION:  1.  No active lung disease.  Mild cardiomegaly. 2.  Negative right rib detail.   Original Report Authenticated By: Dwyane Dee, M.D.    US Abdomen Complete  04/20/2012  *RADIOLOGY REPORT*  Clinical Data:  Abdominal pain, nausea and vomiting, morbid obesity, diabetes  COMPLETE ABDOMINAL ULTRASOUND  Comparison:  CT abdomen pelvis of 03/02/2010  Findings:  Gallbladder:  There is strong acoustical shadowing from the gallbladder consistent with multiple gallstones filling the gallbladder.  However there is no pain over gallbladder currently with compression.  Common bile duct:  The common bile duct is normal measuring 3.0 mm in diameter.  Liver:  The liver is somewhat inhomogeneous which may indicate mild fatty infiltration.  No focal abnormality is seen.  IVC:  Appears normal.  Pancreas:  The ultrasound technologist describes an inhomogeneous somewhat hypoechoic structure in the neck of the pancreas of 4.6 x 3.6 x 5.5 cm.  I did review the CT from February 2012 showing no abnormality.  Repeat CT of the abdomen using pancreatic protocol is recommended to evaluate for a possible pancreatic lesion.  Spleen:  The spleen is normal measuring  12.5 cm sagittally.  Right Kidney:  No hydronephrosis is seen.  The right kidney measures 12.0 cm sagittally.  Left Kidney:  No hydronephrosis is noted.  The left kidney measures 13.0 cm.  Abdominal aorta:  The abdominal aorta is obscured by bowel gas.  IMPRESSION:  1.  Multiple gallstones fill the gallbladder.  However there is no present evidence of acute cholecystitis by ultrasound. 2.  Hypoechoic rounded structure in the mid body - neck of the pancreas as described above.  Recommend CT of the abdomen using pancreatic protocol to evaluate further. 3.  Inhomogeneous liver may indicate mild fatty infiltration.   Original Report Authenticated By: Dwyane Dee, M.D.    Ct Abdomen Pelvis W Contrast  04/20/2012  *RADIOLOGY REPORT*  Clinical Data: Right-sided  abdominal pain, nausea, known gallstones, history bladder carcinoma with treatment in 2012  CT ABDOMEN AND PELVIS WITH CONTRAST  Technique:  Multidetector CT imaging of the abdomen and pelvis was performed following the standard protocol during bolus administration of intravenous contrast.  Contrast: 50mL OMNIPAQUE IOHEXOL 300 MG/ML  SOLN, OMNIPAQUE IOHEXOL 300 MG/ML  SOLN  Comparison: CT abdomen pelvis of 03/02/2010  Findings: The lung bases are clear.  However there are multiple poorly defined low attenuation lesions scattered diffusely throughout the liver which appear new, consistent with diffuse liver metastases.  One of the larger of these lesions is within the lateral segment of the left lobe of liver anteriorly measuring 25 mm in diameter.  The gallbladder is contracted and there are faintly calcified gallstones present as noted by ultrasound.  The pancreatic head is fatty infiltrated.  However, there is a solid appearing mass within the mid body of the pancreas measuring 3.6 x 4.7 cm consistent with pancreatic body carcinoma.  The tail of the pancreas is somewhat atrophic and the pancreatic duct is not dilated.  The adrenal glands are unremarkable and the spleen remains slightly prominent.  There are multiple lymph nodes surrounding the pancreatic head with one of the larger nodes measuring 16 mm in short axis diameter consistent with metastatic disease.  Multiple mesenteric and retroperitoneal lymph nodes are present.  The kidneys enhance with no focal abnormality and no calculi are seen.  On delayed images, the pelvocaliceal systems are unremarkable.  The abdominal aorta is normal in caliber.  IMPRESSION: 1.  3.6 x 4.7 cm mass in the mid body of the pancreas consistent with pancreatic carcinoma.  2.  Multiple diffuse liver metastases.  3.  Peripancreatic as well as retroperitoneal and mesenteric adenopathy.   Original Report Authenticated By: Dwyane Dee, M.D.      1. Pancreatic cancer   2.  Cholelithiases   3. Abdominal pain, acute, right upper quadrant   4. Transaminitis       MDM  BP 129/62  Pulse 71  Temp(Src) 98.5 F (36.9 C)  Resp 26  SpO2 99%  I have reviewed nursing notes and vital signs. I personally reviewed the imaging tests through PACS system  I reviewed available ER/hospitalization records thought the EMR         Fayrene Helper, [place] 04/20/12 1711

## 2012-04-20 NOTE — ED Notes (Signed)
Returned from ct 

## 2012-04-20 NOTE — ED Notes (Signed)
Patient transported to CT 

## 2012-04-21 ENCOUNTER — Inpatient Hospital Stay (HOSPITAL_COMMUNITY): Payer: Medicare Other

## 2012-04-21 ENCOUNTER — Encounter (HOSPITAL_COMMUNITY): Payer: Self-pay | Admitting: Radiology

## 2012-04-21 ENCOUNTER — Telehealth: Payer: Self-pay | Admitting: Oncology

## 2012-04-21 DIAGNOSIS — R52 Pain, unspecified: Secondary | ICD-10-CM

## 2012-04-21 DIAGNOSIS — R1011 Right upper quadrant pain: Secondary | ICD-10-CM

## 2012-04-21 DIAGNOSIS — K869 Disease of pancreas, unspecified: Secondary | ICD-10-CM

## 2012-04-21 DIAGNOSIS — K769 Liver disease, unspecified: Secondary | ICD-10-CM

## 2012-04-21 LAB — BASIC METABOLIC PANEL
BUN: 10 mg/dL (ref 6–23)
Calcium: 8.2 mg/dL — ABNORMAL LOW (ref 8.4–10.5)
GFR calc Af Amer: >90 mL/min (ref >90)
GFR calc non Af Amer: >90 mL/min (ref >90)
Glucose, Bld: 121 mg/dL — ABNORMAL HIGH (ref 70–99)
Sodium: 136 mEq/L (ref 135–145)

## 2012-04-21 LAB — CBC
MCH: 28.2 pg (ref 26.0–34.0)
MCHC: 33 g/dL (ref 30.0–36.0)
Platelets: 198 10*3/uL (ref 150–400)
RBC: 4.26 MIL/uL (ref 4.22–5.81)

## 2012-04-21 LAB — GLUCOSE, CAPILLARY
Glucose-Capillary: 121 mg/dL — ABNORMAL HIGH (ref 70–99)
Glucose-Capillary: 114 mg/dL — ABNORMAL HIGH (ref 70–99)
Glucose-Capillary: 156 mg/dL — ABNORMAL HIGH (ref 70–99)

## 2012-04-21 LAB — HEMOGLOBIN A1C: Hgb A1c MFr Bld: 6.2 % — ABNORMAL HIGH (ref <5.7)

## 2012-04-21 MED ORDER — FENTANYL CITRATE 0.05 MG/ML IJ SOLN
INTRAMUSCULAR | Status: AC
  Filled 2012-04-21: qty 6

## 2012-04-21 MED ORDER — MIDAZOLAM HCL 2 MG/2ML IJ SOLN
INTRAMUSCULAR | Status: AC
  Filled 2012-04-21: qty 6

## 2012-04-21 MED ORDER — PROMETHAZINE HCL 25 MG/ML IJ SOLN: 12.5000 mg | Freq: Four times a day (QID) | INTRAMUSCULAR | Status: DC | PRN

## 2012-04-21 MED ORDER — INSULIN DETEMIR 100 UNIT/ML ~~LOC~~ SOLN
10.0000 [IU] | Freq: Every evening | SUBCUTANEOUS | Status: DC
  Administered 2012-04-21 – 2012-04-24 (×4): 10 [IU] via SUBCUTANEOUS
  Filled 2012-04-21 (×6): qty 0.1

## 2012-04-21 MED ORDER — FENTANYL CITRATE 0.05 MG/ML IJ SOLN
INTRAMUSCULAR | Status: AC | PRN
  Administered 2012-04-21 (×2): 100 ug via INTRAVENOUS

## 2012-04-21 MED ORDER — MIDAZOLAM HCL 2 MG/2ML IJ SOLN
INTRAMUSCULAR | Status: AC | PRN
  Administered 2012-04-21 (×2): 1 mg via INTRAVENOUS

## 2012-04-21 MED ORDER — HYDROCODONE-ACETAMINOPHEN 5-325 MG PO TABS: 1.0000 | ORAL_TABLET | ORAL | Status: DC | PRN

## 2012-04-21 MED ORDER — BIOTENE DRY MOUTH MT LIQD
15.0000 mL | Freq: Two times a day (BID) | OROMUCOSAL | Status: DC
  Administered 2012-04-21 – 2012-04-25 (×8): 15 mL via OROMUCOSAL

## 2012-04-21 NOTE — Progress Notes (Signed)
Biopsy site C, D, and I. 

## 2012-04-21 NOTE — Progress Notes (Signed)
Biopsy site C, D and I. 

## 2012-04-21 NOTE — H&P (Signed)
HPI: [name] is an 62 y.o. male who is unfortunately found to have a pancreatic mass with lesions in the liver concerning for metastasis. His CA 19-9 is >140000. IR is requested to do US liver lesion biopsy to obtain tissue diagnosis. PMHx and meds reviewed. Pt with COPD and OSA, uses CPAP at night. Only c/o has been abd pain for the past several months.  Past Medical History:  Past Medical History  Diagnosis Date  . Diabetes mellitus without complication, Insulin dependent   . COPD (chronic obstructive pulmonary disease)   . Cancer     bladder    Past Surgical History: History reviewed. No pertinent past surgical history.  Family History: No family history on file.  Social History:  reports that he has been smoking Cigarettes.  Marland Kitchen He reports that he does not drink alcohol or use illicit drugs.  Allergies:  Allergies  Allergen Reactions  . Epinephrine   . Keflex (Cephalexin)     Medications: Medications Prior to Admission  Medication Sig Dispense Refill  . allopurinol (ZYLOPRIM) 300 MG tablet Take 300 mg by mouth daily.      Marland Kitchen ALPRAZolam (XANAX) 1 MG tablet Take 3 mg by mouth at bedtime as needed for sleep.      . furosemide (LASIX) 80 MG tablet Take 80 mg by mouth daily.      Marland Kitchen HYDROcodone-acetaminophen (NORCO) 10-325 MG per tablet Take 1 tablet by mouth 3 (three) times daily.      . insulin aspart (NOVOLOG) 100 UNIT/ML injection Inject 20 Units into the skin 3 (three) times daily before meals.      . insulin detemir (LEVEMIR) 100 UNIT/ML injection Inject 20 Units into the skin every evening.      Marland Kitchen ketoconazole (NIZORAL) 2 % cream Apply 1 application topically daily. Applies to right leg      . penicillin v potassium (VEETID) 500 MG tablet Take 500 mg by mouth 3 (three) times daily.      . polyethylene glycol (MIRALAX / GLYCOLAX) packet Take 17 g by mouth daily.      . potassium chloride (K-DUR,KLOR-CON) 10 MEQ tablet Take 10 mEq by mouth daily.      Marland Kitchen zinc oxide  (BALMEX) 11.3 % CREA cream Apply 1 application topically 2 (two) times daily. Applies to feet and legs        Please HPI for pertinent positives, otherwise complete 10 system ROS negative.  Physical Exam: Blood pressure 131/40, pulse 81, temperature 99.5 F (37.5 C), temperature source Oral, resp. rate 16, height 6' (1.829 m), weight 412 lb (186.882 kg), SpO2 93.00%. Body mass index is 55.87 kg/(m^2).   General Appearance:  Alert, cooperative male, NAD, Morbidly obese  Head:  Normocephalic, without obvious abnormality, atraumatic  ENT: Unremarkable  Neck: Supple, symmetrical, trachea midline,   Lungs:   Clear to auscultation bilaterally, no w/r/r, respirations unlabored without use of accessory muscles.  Chest Wall:  No tenderness or deformity  Heart:  Regular rate and rhythm, S1, S2 normal, no murmur, rub or gallop.   Abdomen:   Soft, non-tender, non distended. Bowel sounds active all four quadrants,  no masses, no organomegaly.  Neurologic: Normal affect, no gross deficits.   Results for orders placed during the hospital encounter of 04/20/12 (from the past 48 hour(s))  CBC WITH DIFFERENTIAL     Status: Abnormal   Collection Time    04/20/12 11:11 AM      Result Value Range   WBC 11.3 (*)  4.0 - 10.5 K/uL   RBC 4.50  4.22 - 5.81 MIL/uL   Hemoglobin 12.7 (*) 13.0 - 17.0 g/dL   HCT 16.1 (*) 09.6 - 04.5 %   MCV 84.9  78.0 - 100.0 fL   MCH 28.2  26.0 - 34.0 pg   MCHC 33.2  30.0 - 36.0 g/dL   RDW 40.9  81.1 - 91.4 %   Platelets 201  150 - 400 K/uL   Neutrophils Relative 70  43 - 77 %   Neutro Abs 7.9 (*) 1.7 - 7.7 K/uL   Lymphocytes Relative 14  12 - 46 %   Lymphs Abs 1.6  0.7 - 4.0 K/uL   Monocytes Relative 12  3 - 12 %   Monocytes Absolute 1.4 (*) 0.1 - 1.0 K/uL   Eosinophils Relative 4  0 - 5 %   Eosinophils Absolute 0.4  0.0 - 0.7 K/uL   Basophils Relative 1  0 - 1 %   Basophils Absolute 0.1  0.0 - 0.1 K/uL  COMPREHENSIVE METABOLIC PANEL     Status: Abnormal   Collection  Time    04/20/12 11:11 AM      Result Value Range   Sodium 134 (*) 135 - 145 mEq/L   Potassium 3.6  3.5 - 5.1 mEq/L   Chloride 96  96 - 112 mEq/L   CO2 26  19 - 32 mEq/L   Glucose, Bld 90  70 - 99 mg/dL   BUN 10  6 - 23 mg/dL   Creatinine, Ser 7.82  0.50 - 1.35 mg/dL   Calcium 9.2  8.4 - 95.6 mg/dL   Total Protein 6.4  6.0 - 8.3 g/dL   Albumin 2.8 (*) 3.5 - 5.2 g/dL   AST 213 (*) 0 - 37 U/L   ALT 132 (*) 0 - 53 U/L   Alkaline Phosphatase 302 (*) 39 - 117 U/L   Total Bilirubin 5.2 (*) 0.3 - 1.2 mg/dL   GFR calc non Af Amer >90  >90 mL/min   GFR calc Af Amer >90  >90 mL/min   Comment:            The eGFR has been calculated     using the CKD EPI equation.     This calculation has not been     validated in all clinical     situations.     eGFR's persistently     <90 mL/min signify     possible Chronic Kidney Disease.  LIPASE, BLOOD     Status: None   Collection Time    04/20/12 11:11 AM      Result Value Range   Lipase 20  11 - 59 U/L  HEMOGLOBIN A1C     Status: Abnormal   Collection Time    04/20/12 11:11 AM      Result Value Range   Hemoglobin A1C 6.2 (*) <5.7 %   Comment: (NOTE)                                                                               According to the ADA Clinical Practice Recommendations for 2011, when     HbA1c is used as a  screening test:      >=6.5%   Diagnostic of Diabetes Mellitus               (if abnormal result is confirmed)     5.7-6.4%   Increased risk of developing Diabetes Mellitus     References:Diagnosis and Classification of Diabetes Mellitus,Diabetes     Care,2011,34(Suppl 1):S62-S69 and Standards of Medical Care in             Diabetes - 2011,Diabetes Care,2011,34 (Suppl 1):S11-S61.   Mean Plasma Glucose 131 (*) <117 mg/dL  PROTIME-INR     Status: Abnormal   Collection Time    04/20/12  5:43 PM      Result Value Range   Prothrombin Time 17.6 (*) 11.6 - 15.2 seconds   INR 1.49  0.00 - 1.49  CANCER ANTIGEN 19-9     Status:  Abnormal   Collection Time    04/20/12  5:43 PM      Result Value Range   CA 19-9 >140000.0 (*) <35.0 U/mL  GLUCOSE, CAPILLARY     Status: Abnormal   Collection Time    04/20/12  9:17 PM      Result Value Range   Glucose-Capillary 144 (*) 70 - 99 mg/dL   Comment 1 Notify RN    BASIC METABOLIC PANEL     Status: Abnormal   Collection Time    04/21/12  4:10 AM      Result Value Range   Sodium 136  135 - 145 mEq/L   Potassium 3.8  3.5 - 5.1 mEq/L   Chloride 99  96 - 112 mEq/L   CO2 27  19 - 32 mEq/L   Glucose, Bld 121 (*) 70 - 99 mg/dL   BUN 10  6 - 23 mg/dL   Creatinine, Ser 2.84  0.50 - 1.35 mg/dL   Calcium 8.2 (*) 8.4 - 10.5 mg/dL   GFR calc non Af Amer >90  >90 mL/min   GFR calc Af Amer >90  >90 mL/min   Comment:            The eGFR has been calculated     using the CKD EPI equation.     This calculation has not been     validated in all clinical     situations.     eGFR's persistently     <90 mL/min signify     possible Chronic Kidney Disease.  CBC     Status: Abnormal   Collection Time    04/21/12  4:10 AM      Result Value Range   WBC 10.6 (*) 4.0 - 10.5 K/uL   RBC 4.26  4.22 - 5.81 MIL/uL   Hemoglobin 12.0 (*) 13.0 - 17.0 g/dL   HCT 13.2 (*) 44.0 - 10.2 %   MCV 85.4  78.0 - 100.0 fL   MCH 28.2  26.0 - 34.0 pg   MCHC 33.0  30.0 - 36.0 g/dL   RDW 72.5  36.6 - 44.0 %   Platelets 198  150 - 400 K/uL  GLUCOSE, CAPILLARY     Status: Abnormal   Collection Time    04/21/12  7:50 AM      Result Value Range   Glucose-Capillary 109 (*) 70 - 99 mg/dL   Comment 1 Documented in Chart     Comment 2 Notify RN     Dg Ribs Unilateral W/chest Right  04/20/2012  *RADIOLOGY REPORT*  Clinical Data: Right lower rib pain, fell  1 week ago, shortness of breath  RIGHT RIBS AND CHEST - 3+ VIEW  Comparison: Chest x-ray of 03/19/2010  Findings: No active infiltrate or effusion is seen.  Mild cardiomegaly is stable.  Right rib detail films show no acute right rib fracture.  IMPRESSION:   1.  No active lung disease.  Mild cardiomegaly. 2.  Negative right rib detail.   Original Report Authenticated By: Dwyane Dee, M.D.    US Abdomen Complete  04/20/2012  *RADIOLOGY REPORT*  Clinical Data:  Abdominal pain, nausea and vomiting, morbid obesity, diabetes  COMPLETE ABDOMINAL ULTRASOUND  Comparison:  CT abdomen pelvis of 03/02/2010  Findings:  Gallbladder:  There is strong acoustical shadowing from the gallbladder consistent with multiple gallstones filling the gallbladder.  However there is no pain over gallbladder currently with compression.  Common bile duct:  The common bile duct is normal measuring 3.0 mm in diameter.  Liver:  The liver is somewhat inhomogeneous which may indicate mild fatty infiltration.  No focal abnormality is seen.  IVC:  Appears normal.  Pancreas:  The ultrasound technologist describes an inhomogeneous somewhat hypoechoic structure in the neck of the pancreas of 4.6 x 3.6 x 5.5 cm.  I did review the CT from February 2012 showing no abnormality.  Repeat CT of the abdomen using pancreatic protocol is recommended to evaluate for a possible pancreatic lesion.  Spleen:  The spleen is normal measuring 12.5 cm sagittally.  Right Kidney:  No hydronephrosis is seen.  The right kidney measures 12.0 cm sagittally.  Left Kidney:  No hydronephrosis is noted.  The left kidney measures 13.0 cm.  Abdominal aorta:  The abdominal aorta is obscured by bowel gas.  IMPRESSION:  1.  Multiple gallstones fill the gallbladder.  However there is no present evidence of acute cholecystitis by ultrasound. 2.  Hypoechoic rounded structure in the mid body - neck of the pancreas as described above.  Recommend CT of the abdomen using pancreatic protocol to evaluate further. 3.  Inhomogeneous liver may indicate mild fatty infiltration.   Original Report Authenticated By: Dwyane Dee, M.D.    Ct Abdomen Pelvis W Contrast  04/20/2012  *RADIOLOGY REPORT*  Clinical Data: Right-sided abdominal pain, nausea, known  gallstones, history bladder carcinoma with treatment in 2012  CT ABDOMEN AND PELVIS WITH CONTRAST  Technique:  Multidetector CT imaging of the abdomen and pelvis was performed following the standard protocol during bolus administration of intravenous contrast.  Contrast: 50mL OMNIPAQUE IOHEXOL 300 MG/ML  SOLN, OMNIPAQUE IOHEXOL 300 MG/ML  SOLN  Comparison: CT abdomen pelvis of 03/02/2010  Findings: The lung bases are clear.  However there are multiple poorly defined low attenuation lesions scattered diffusely throughout the liver which appear new, consistent with diffuse liver metastases.  One of the larger of these lesions is within the lateral segment of the left lobe of liver anteriorly measuring 25 mm in diameter.  The gallbladder is contracted and there are faintly calcified gallstones present as noted by ultrasound.  The pancreatic head is fatty infiltrated.  However, there is a solid appearing mass within the mid body of the pancreas measuring 3.6 x 4.7 cm consistent with pancreatic body carcinoma.  The tail of the pancreas is somewhat atrophic and the pancreatic duct is not dilated.  The adrenal glands are unremarkable and the spleen remains slightly prominent.  There are multiple lymph nodes surrounding the pancreatic head with one of the larger nodes measuring 16 mm in short axis diameter consistent with metastatic disease.  Multiple mesenteric and retroperitoneal lymph nodes are present.  The kidneys enhance with no focal abnormality and no calculi are seen.  On delayed images, the pelvocaliceal systems are unremarkable.  The abdominal aorta is normal in caliber.  IMPRESSION: 1.  3.6 x 4.7 cm mass in the mid body of the pancreas consistent with pancreatic carcinoma.  2.  Multiple diffuse liver metastases.  3.  Peripancreatic as well as retroperitoneal and mesenteric adenopathy.   Original Report Authenticated By: Dwyane Dee, M.D.     Assessment/Plan Pancreatic mass with multiple liver lesions  highly suspicious for malignant process. Discussed plan for US guided liver lesion biopsy. Explained procedure, risks, complications, use of sedation. Labs reviewed, PLTs, coags ok. Heparin has been held today Consent signed in chart  Brayton El PA-C 04/21/2012, 9:19 AM

## 2012-04-21 NOTE — Clinical Documentation Improvement (Signed)
RESPIRATORY FAILURE DOCUMENTATION CLARIFICATION QUERY   THIS DOCUMENT IS NOT A PERMANENT PART OF THE MEDICAL RECORD  TO RESPOND TO THE THIS QUERY, FOLLOW THE INSTRUCTIONS BELOW:  1. If needed, update documentation for the patient's encounter via the notes activity.  2. Access this query again and click edit on the In [employer].  3. After updating, or not, click F2 to complete all highlighted (required) fields concerning your review. Select "additional documentation in the medical record" OR "no additional documentation provided".  4. Click Sign note button.  5. The deficiency will fall out of your In Basket *Please let us know if you are not able to complete this workflow by phone or e-mail (listed below).  Please update your documentation within the medical record to reflect your response to this query.                                                                                    04/21/12  Dear Dr. Rito Ehrlich Marton Redwood,  In a better effort to capture your patient's severity of illness, reflect appropriate length of stay and utilization of resources, a review of the patient medical record has revealed the following indicators.    Based on your clinical judgment, please clarify and document in a progress note and/or discharge summary the clinical condition associated with the following supporting information:  In responding to this query please exercise your independent judgment.  The fact that a query is asked, does not imply that any particular answer is desired or expected. Noted patient is on home O2 with  OSA and COPD. Please provide specificity regarding respiratory failure : Acuity and Type Possible Clinical Conditions?  _______Acute Respiratory Failure _______Acute on Chronic Respiratory Failure _______Chronic Respiratory Failure  _______Other Condition________________ _______Cannot Clinically Determine    Supporting Information  Risk Factors: COPD, OSA, BMI>55,  CPAP, DM, morbid obesity   Signs&Symptoms:   pn on 04/21/12 :   "oxygen to be continued at 2 L nasal cannula. :COPD on home oxygen"      O2 concentration: 3 Liters with sats 91-99%     O2 mode: [place]   Radiology:/14/2014 *RADIOLOGY REPORT* Clinical Data: Right lower rib pain, fell 1 week ago, shortness of breath RIGHT RIBS AND CHEST - 3+ VIEW Comparison: Chest x-ray of 03/19/2010 Findings: No active infiltrate or effusion is seen. Mild cardiomegaly is stable. Right rib detail films show no acute right rib fracture. IMPRESSION: 1. No active lung disease. Mild cardiomegaly. 2. Negative right rib detail   Treatment:  Respiratory Treatment: Proventil neb every 2 hrs prn   Treatment: O2/L/min,   You may use possible, probable, or suspect with inpatient documentation. possible, probable, suspected diagnoses MUST be documented at the time of discharge  Reviewed:  no additional documentation provided  Thank You,  Andy Gauss RN, BSN  Clinical Documentation Specialist:  Pager [phone] Office [phone] [facility] Information Management [place]

## 2012-04-21 NOTE — Care Management Note (Signed)
CARE MANAGEMENT NOTE 04/21/2012  Patient:  [name], [name]   Account Number:  [id number]  Date Initiated:  04/21/2012  Documentation initiated by:  Kiowa Peifer  Subjective/Objective Assessment:   62 yo male admitted with pancreatic lesions. PTA pr from [place] with spouse.     Action/Plan:   Home when stable   Anticipated DC Date:     Anticipated DC Plan:  HOME/SELF CARE  In-house referral  NA      DC Planning Services  CM consult      Choice offered to / List presented to:  NA   DME arranged  NA      DME agency  NA     HH arranged  NA      HH agency  NA   Status of service:  In process, will continue to follow Medicare Important Message given?   (If response is "NO", the following Medicare IM given date fields will be blank) Date Medicare IM given:   Date Additional Medicare IM given:    Discharge Disposition:    Per UR Regulation:  Reviewed for med. necessity/level of care/duration of stay  If discussed at Long Length of Stay Meetings, dates discussed:    Comments:  04/21/12 1255 Farrell Broerman,RN,BSN [phone] Chart reviewed. No needs assessed at this time. Pt from home with spouse with no prior [facility] services.

## 2012-04-21 NOTE — Progress Notes (Signed)
[facility] PROGRESS NOTE  [name] [id number]:[id number] DOB: Dec 10, 1950 DOA: 04/20/2012  PCP: Aida Puffer, MD  Brief HPI: [name] is a 62 y.o. male, with history of morbid obesity, diabetes mellitus type 2 insulin-dependent, COPD on home oxygen, history of bladder cancer in the past, who has had chronic abdominal pain for the last 2-3 years which has been getting worse for the last 3 months, also described 120 pounds of unintentional weight loss in the last 3 months, patient also stated that about 5 days ago he suffered a trip and fall at home and hurt his right upper quadrant and since then his epigastric abdominal pain and right upper quadrant abdominal pain has been worse. He described pain as sharp with at times radiating to the back, worse with food better with bowel rest and pain medications, no associated symptoms. In the ER workup suggestive of pancreatic mass on CT scan, transaminitis with elevated total bilirubin, I was asked to admit the patient for possible pancreatic cancer with metastases to the liver.  Past medical history:  Past Medical History  Diagnosis Date  . Diabetes mellitus without complication   . COPD (chronic obstructive pulmonary disease)   . Cancer     bladder    Consultants: IR for biopsy, Dr. Darnelle Catalan notified.  Procedures: US guided Biopsy to be attempted 4/15  Antibiotics: Penicillin V from home  Subjective: Patient complains of pain in the abdomen at 5/10. Diffuse. Associated with nausea and vomiting. Was diagnosed with diabetes recently.  Objective: Vital Signs  Filed Vitals:   04/20/12 1835 04/20/12 2000 04/20/12 2118 04/21/12 0415  BP:   149/64 131/40  Pulse: 72 82 77 81  Temp: 97.9 F (36.6 C)  98.8 F (37.1 C) 99.5 F (37.5 C)  TempSrc: Oral  Oral Oral  Resp:   18 16  Height: 6' (1.829 m)     Weight: 186.882 kg (412 lb)     SpO2: 93%  91% 93%    Intake/Output Summary (Last 24 hours) at 04/21/12 1053 Last data filed at  04/21/12 0515  Gross per 24 hour  Intake 770.48 ml  Output     50 ml  Net 720.48 ml   Filed Weights   04/20/12 1835  Weight: 186.882 kg (412 lb)    General appearance: alert, cooperative, no distress and morbidly obese Head: Normocephalic, without obvious abnormality, atraumatic Resp: Decreased air entry at the bases. No crackles. Cardio: regular rate and rhythm, S1, S2 normal, no murmur, click, rub or gallop GI: Morbidly obese, Tender diffusely without rebound rigidity. BS present. No masses appreciated on this limited exam. Extremities: Chronic venous stasis noted both legs. Skin: chronic skin changes noted both legs. Neurologic: No focal deficits.  Lab Results:  Basic Metabolic Panel:  Recent Labs Lab 04/20/12 1111 04/21/12 0410  NA 134* 136  K 3.6 3.8  CL 96 99  CO2 26 27  GLUCOSE 90 121*  BUN 10 10  CREATININE 0.67 0.70  CALCIUM 9.2 8.2*   Liver Function Tests:  Recent Labs Lab 04/20/12 1111  AST 143*  ALT 132*  ALKPHOS 302*  BILITOT 5.2*  PROT 6.4  ALBUMIN 2.8*    Recent Labs Lab 04/20/12 1111  LIPASE 20   CBC:  Recent Labs Lab 04/20/12 1111 04/21/12 0410  WBC 11.3* 10.6*  NEUTROABS 7.9*  --   HGB 12.7* 12.0*  HCT 38.2* 36.4*  MCV 84.9 85.4  PLT 201 198   CBG:  Recent Labs Lab 04/20/12 2117 04/21/12 0750  GLUCAP 144* 109*    No results found for this or any previous visit (from the past 240 hour(s)).    Studies/Results: Dg Ribs Unilateral W/chest Right  04/20/2012  *RADIOLOGY REPORT*  Clinical Data: Right lower rib pain, fell 1 week ago, shortness of breath  RIGHT RIBS AND CHEST - 3+ VIEW  Comparison: Chest x-ray of 03/19/2010  Findings: No active infiltrate or effusion is seen.  Mild cardiomegaly is stable.  Right rib detail films show no acute right rib fracture.  IMPRESSION:  1.  No active lung disease.  Mild cardiomegaly. 2.  Negative right rib detail.   Original Report Authenticated By: Dwyane Dee, M.D.    US Abdomen  Complete  04/20/2012  *RADIOLOGY REPORT*  Clinical Data:  Abdominal pain, nausea and vomiting, morbid obesity, diabetes  COMPLETE ABDOMINAL ULTRASOUND  Comparison:  CT abdomen pelvis of 03/02/2010  Findings:  Gallbladder:  There is strong acoustical shadowing from the gallbladder consistent with multiple gallstones filling the gallbladder.  However there is no pain over gallbladder currently with compression.  Common bile duct:  The common bile duct is normal measuring 3.0 mm in diameter.  Liver:  The liver is somewhat inhomogeneous which may indicate mild fatty infiltration.  No focal abnormality is seen.  IVC:  Appears normal.  Pancreas:  The ultrasound technologist describes an inhomogeneous somewhat hypoechoic structure in the neck of the pancreas of 4.6 x 3.6 x 5.5 cm.  I did review the CT from February 2012 showing no abnormality.  Repeat CT of the abdomen using pancreatic protocol is recommended to evaluate for a possible pancreatic lesion.  Spleen:  The spleen is normal measuring 12.5 cm sagittally.  Right Kidney:  No hydronephrosis is seen.  The right kidney measures 12.0 cm sagittally.  Left Kidney:  No hydronephrosis is noted.  The left kidney measures 13.0 cm.  Abdominal aorta:  The abdominal aorta is obscured by bowel gas.  IMPRESSION:  1.  Multiple gallstones fill the gallbladder.  However there is no present evidence of acute cholecystitis by ultrasound. 2.  Hypoechoic rounded structure in the mid body - neck of the pancreas as described above.  Recommend CT of the abdomen using pancreatic protocol to evaluate further. 3.  Inhomogeneous liver may indicate mild fatty infiltration.   Original Report Authenticated By: Dwyane Dee, M.D.    Ct Abdomen Pelvis W Contrast  04/20/2012  *RADIOLOGY REPORT*  Clinical Data: Right-sided abdominal pain, nausea, known gallstones, history bladder carcinoma with treatment in 2012  CT ABDOMEN AND PELVIS WITH CONTRAST  Technique:  Multidetector CT imaging of the  abdomen and pelvis was performed following the standard protocol during bolus administration of intravenous contrast.  Contrast: 50mL OMNIPAQUE IOHEXOL 300 MG/ML  SOLN, OMNIPAQUE IOHEXOL 300 MG/ML  SOLN  Comparison: CT abdomen pelvis of 03/02/2010  Findings: The lung bases are clear.  However there are multiple poorly defined low attenuation lesions scattered diffusely throughout the liver which appear new, consistent with diffuse liver metastases.  One of the larger of these lesions is within the lateral segment of the left lobe of liver anteriorly measuring 25 mm in diameter.  The gallbladder is contracted and there are faintly calcified gallstones present as noted by ultrasound.  The pancreatic head is fatty infiltrated.  However, there is a solid appearing mass within the mid body of the pancreas measuring 3.6 x 4.7 cm consistent with pancreatic body carcinoma.  The tail of the pancreas is somewhat atrophic and the pancreatic duct is  not dilated.  The adrenal glands are unremarkable and the spleen remains slightly prominent.  There are multiple lymph nodes surrounding the pancreatic head with one of the larger nodes measuring 16 mm in short axis diameter consistent with metastatic disease.  Multiple mesenteric and retroperitoneal lymph nodes are present.  The kidneys enhance with no focal abnormality and no calculi are seen.  On delayed images, the pelvocaliceal systems are unremarkable.  The abdominal aorta is normal in caliber.  IMPRESSION: 1.  3.6 x 4.7 cm mass in the mid body of the pancreas consistent with pancreatic carcinoma.  2.  Multiple diffuse liver metastases.  3.  Peripancreatic as well as retroperitoneal and mesenteric adenopathy.   Original Report Authenticated By: Dwyane Dee, M.D.     Medications:  Scheduled: . allopurinol  300 mg Oral Daily  . furosemide  80 mg Oral Daily  . heparin  5,000 Units Subcutaneous Q8H  . HYDROcodone-acetaminophen  1 tablet Oral TID  . insulin aspart   0-9 Units Subcutaneous TID WC  . insulin aspart  20 Units Subcutaneous TID AC  . insulin detemir  20 Units Subcutaneous QPM  . ketoconazole  1 application Topical Daily  . penicillin v potassium  500 mg Oral TID  . polyethylene glycol  17 g Oral Daily  . potassium chloride  10 mEq Oral Daily  . zinc oxide  1 application Topical BID   Continuous:  ZOX:WRUEAVWUJ, ALPRAZolam, alum & mag hydroxide-simeth, guaiFENesin-dextromethorphan, morphine injection, ondansetron (ZOFRAN) IV, ondansetron, polyethylene glycol  Assessment/Plan:  Principal Problem:   Pancreatic cancer metastasized to liver Active Problems:   Diabetes mellitus without complication   COPD (chronic obstructive pulmonary disease)   Abdominal pain    Pancreatic Mass with Liver Lesions Likely explanation for his abdominal pain. Patient with history of weight loss as well over last 3 months. Has hyperbilirubinemia. Await US guided biopsy. Discussed with Dr. Darnelle Catalan and oncology will evaluate patient. CA 19-9 is significantly elevated at greater than 140000. Fractionate bilirubin. Monitor LFT's.   COPD on home oxygen Stable. He is not wheezing nor is he short of breath. Nebulizer treatments on a needed basis, oxygen to be continued at 2 L nasal cannula.   Pannus cellulitis, left lower leg venous stasis ulcer, massive lymphedema in the right lower extremity Continue with home antibiotic for now. Nystatin cream will be continued. Local wound care. Monitor clinically.   Diabetes mellitus type 2, insulin-dependent Continue with home insulin at lower dose. HBA1c is 6.6. SSI.  Morbid Obesity   Stable. Reports weight loss as mentioned earlier.  History of Gout Stable  DVT Prophylaxis: Heparin  Family Communication: Discussed with patient and his wife at bedside. Code Status: Full Code Disposition Plan: Likely return home when improved.    LOS: 1 day   [facility]  [facility] Pager [phone] 04/21/2012,  10:53 AM  If 8PM-8AM, please contact night-coverage at [email or web address], password [facility]

## 2012-04-21 NOTE — Progress Notes (Signed)
INITIAL NUTRITION ASSESSMENT  DOCUMENTATION CODES Per approved criteria  -Morbid Obesity   INTERVENTION: - Recommend Glucerna shakes BID when diet advanced - Will continue to monitor   NUTRITION DIAGNOSIS: Inadequate oral intake related to inability to eat as evidenced by NPO.   Goal: 1. Resolution of nausea 2. Advance diet as tolerated to diabetic diet  Monitor:  Weights, labs, diet advancement  Reason for Assessment: Nutrition risk   62 y.o. male  Admitting Dx: Pancreatic cancer metastasized to liver  ASSESSMENT: Pt with history of morbid obesity, diabetes mellitus type 2 insulin-dependent, COPD on home oxygen, history of bladder cancer in the past, who has had chronic abdominal pain for the last 2-3 years which has been getting worse for the last 3 months. Pt found to have metastatic pancreatic CA.   Met with pt and wife who report since January, pt has been eating healthier and trying to follow diabetic diet. Pt reports he has been consuming baked meats, lots of vegetables, and cutting back on starches. Pt reports he has been following a low fat diet. Pt states because of these diet changes, he has lost 35-40 pounds since then. Pt reports PTA he was only consuming yogurt, soup, and small amounts of snacks for 1 week due to worsening abdominal pain. Pt reports history of nausea for the past several years. Pt reports he takes promethazine at home for nausea which has helped him - text paged MD this information. Pt reports he was able to eat 100% of his dinner last night (grilled chicken, vegetables). Pt interested in getting nutritional supplements once diet advanced.   Height: Ht Readings from Last 1 Encounters:  04/20/12 6' (1.829 m)    Weight: Wt Readings from Last 1 Encounters:  04/20/12 412 lb (186.882 kg)    Ideal Body Weight: 178 lb  % Ideal Body Weight: 231  Wt Readings from Last 10 Encounters:  04/20/12 412 lb (186.882 kg)    Usual Body Weight: 447-452  lb  % Usual Body Weight: 91-92  BMI:  Body mass index is 55.87 kg/(m^2). Class III extreme obesity  Estimated Nutritional Needs: Kcal: 2350-2550 Protein: 95-120g Fluid: 2.3-2.5L/day  Skin: Left leg skin tear  Diet Order: NPO  EDUCATION NEEDS: -No education needs identified at this time   Intake/Output Summary (Last 24 hours) at 04/21/12 1140 Last data filed at 04/21/12 0515  Gross per 24 hour  Intake 770.48 ml  Output     50 ml  Net 720.48 ml    Last BM: 4/14  Labs:   Recent Labs Lab 04/20/12 1111 04/21/12 0410  NA 134* 136  K 3.6 3.8  CL 96 99  CO2 26 27  BUN 10 10  CREATININE 0.67 0.70  CALCIUM 9.2 8.2*  GLUCOSE 90 121*    CBG (last 3)   Recent Labs  04/20/12 2117 04/21/12 0750  GLUCAP 144* 109*    Scheduled Meds: . allopurinol  300 mg Oral Daily  . furosemide  80 mg Oral Daily  . heparin  5,000 Units Subcutaneous Q8H  . HYDROcodone-acetaminophen  1 tablet Oral TID  . insulin aspart  0-9 Units Subcutaneous TID WC  . insulin aspart  20 Units Subcutaneous TID AC  . insulin detemir  10 Units Subcutaneous QPM  . ketoconazole  1 application Topical Daily  . penicillin v potassium  500 mg Oral TID  . polyethylene glycol  17 g Oral Daily  . potassium chloride  10 mEq Oral Daily  . zinc oxide  1 application Topical BID    Continuous Infusions:   Past Medical History  Diagnosis Date  . Diabetes mellitus without complication   . COPD (chronic obstructive pulmonary disease)   . Cancer     bladder    History reviewed. No pertinent past surgical history.    Levon Hedger MS, RD, LDN [phone] Pager [phone] After Hours Pager

## 2012-04-21 NOTE — Progress Notes (Signed)
Patient placed on CPAP. Patient was unaware of his settings at home. Set machine up on Auto Titrate with O2 bleed in. Patient is tolerating well. Will continue to monitor

## 2012-04-21 NOTE — Procedures (Signed)
US liver core 18g x3 No complication No blood loss. See complete dictation in [facility].

## 2012-04-21 NOTE — Progress Notes (Signed)
ID: [name]   DOB: May 22, 1950  Daniel#: [id number]  WUJ#:[id number]  PCP: Aida Puffer, MD SU:  OTHER MD: Ihor Gully, Aida Puffer   HISTORY OF PRESENT ILLNESS: [name] has felt "really bad" for several months. He has had abdominal pain, his diabetes has become harder to manage, he has lost >100 lb weight loss, and he was found to have elevated LFTs leading to a CT of the abdomen which shows a pancreatic mass and liver lesions as detailed below. Today he underwent liver biopsy under ultrasound, with results pending. We were consulted for a presumptive diagnosis of pancreatic cancer.  The patient also has a diagnosis of non-invasive bladder cancer, treated in the past with BCG instillations.  INTERVAL HISTORY: I met with [name] and his pastor Jeannette How 04/21/2012 to review his situation and options.  REVIEW OF SYSTEMS: He is s/p recent biopsy and still flat on his back. He tells me life has been miserable for quite some time now--he is not eager to prolong it. His pain has not been well controlled. He has had headaches, nausea and sometimes vomiting, and has been largely bedbound for some months. The pain is poorly localized but extends more to the left side of the abdomen and the back. He denies constipation or diarrhea. There has been no hematuria or dysuria.  PAST MEDICAL HISTORY: Past Medical History  Diagnosis Date  . Diabetes mellitus without complication   . COPD (chronic obstructive pulmonary disease)   . Cancer     bladder    PAST SURGICAL HISTORY: History reviewed. No pertinent past surgical history.  FAMILY HISTORY No family history on file. The aptient's father also had bladder cancer but died from sepsis at age 96. The patient's mother died from colon cancer in her early 63. The patient had 4 brothers and 5 sisters (many are half-siblings and live in other states). One sister had colon cancer at age 49, but survived.  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 His wife Gaylyn Lambert isalso disabled after some strokes. They had no children. He attends a [employer].   ADVANCED DIRECTIVES: not in place  HEALTH MAINTENANCE: History  Substance Use Topics  . Smoking status: Current Every Day Smoker    Types: Cigarettes  . Smokeless tobacco: Not on file  . Alcohol Use: No     Colonoscopy:  PAP:  Bone density:  Lipid panel:  Allergies  Allergen Reactions  . Epinephrine   . Keflex (Cephalexin)     Current Facility-Administered Medications  Medication Dose Route Frequency Provider Last Rate Last Dose  . albuterol (PROVENTIL) (5 MG/ML) 0.5% nebulizer solution 2.5 mg  2.5 mg Nebulization Q2H PRN Leroy Sea, MD      . allopurinol (ZYLOPRIM) tablet 300 mg  300 mg Oral Daily Leroy Sea, MD   300 mg at 04/21/12 0953  . ALPRAZolam Prudy Feeler) tablet 3 mg  3 mg Oral QHS PRN Leroy Sea, MD   3 mg at 04/20/12 2237  . alum & mag hydroxide-simeth (MAALOX/MYLANTA) 200-200-20 MG/5ML suspension 30 mL  30 mL Oral Q6H PRN Leroy Sea, MD      . fentaNYL (SUBLIMAZE) 0.05 MG/ML injection           . furosemide (LASIX) tablet 80 mg  80 mg Oral Daily Leroy Sea, MD   80 mg at 04/21/12 0953  . guaiFENesin-dextromethorphan (ROBITUSSIN DM) 100-10 MG/5ML syrup 5 mL  5 mL Oral  Q4H PRN Leroy Sea, MD      . heparin injection 5,000 Units  5,000 Units Subcutaneous Q8H Leroy Sea, MD   5,000 Units at 04/20/12 2208  . HYDROcodone-acetaminophen (NORCO) 10-325 MG per tablet 1 tablet  1 tablet Oral TID Leroy Sea, MD   1 tablet at 04/21/12 1528  . HYDROcodone-acetaminophen (NORCO/VICODIN) 5-325 MG per tablet 1-2 tablet  1-2 tablet Oral Q4H PRN Dayne Oley Balm III, MD      . insulin aspart (novoLOG) injection 0-9 Units  0-9 Units Subcutaneous TID WC Leroy Sea, MD   1 Units at 04/21/12 1216  . insulin aspart (novoLOG) injection 20 Units  20 Units Subcutaneous TID AC Leroy Sea, MD      . insulin detemir (LEVEMIR) injection 10 Units  10 Units Subcutaneous QPM Osvaldo Shipper, MD      . ketoconazole (NIZORAL) 2 % cream 1 application  1 application Topical Daily Leroy Sea, MD   1 application at 04/21/12 [phone]  . midazolam (VERSED) 2 MG/2ML injection           . morphine 2 MG/ML injection 2 mg  2 mg Intravenous Q2H PRN Leroy Sea, MD   2 mg at 04/21/12 0840  . ondansetron (ZOFRAN) tablet 4 mg  4 mg Oral Q6H PRN Leroy Sea, MD       Or  . ondansetron (ZOFRAN) injection 4 mg  4 mg Intravenous Q6H PRN Leroy Sea, MD      . penicillin v potassium (VEETID) tablet 500 mg  500 mg Oral TID Leroy Sea, MD   500 mg at 04/21/12 0953  . polyethylene glycol (MIRALAX / GLYCOLAX) packet 17 g  17 g Oral Daily Leroy Sea, MD   17 g at 04/21/12 0953  . polyethylene glycol (MIRALAX / GLYCOLAX) packet 17 g  17 g Oral Daily PRN Leroy Sea, MD      . potassium chloride (K-DUR,KLOR-CON) CR tablet 10 mEq  10 mEq Oral Daily Leroy Sea, MD   10 mEq at 04/21/12 0953  . promethazine (PHENERGAN) injection 12.5 mg  12.5 mg Intravenous Q6H PRN Osvaldo Shipper, MD      . zinc oxide (BALMEX) 11.3 % cream 1 application  1 application Topical BID Leroy Sea, MD   1 application at 04/21/12 [phone]    OBJECTIVE: middle aged white male examined in bed  Filed Vitals:   04/21/12 1800  BP: 155/50  Pulse: 73  Temp: 98.4 F (36.9 C)  Resp: 18     Body mass index is 55.87 kg/(m^2).    ECOG FS: 2-3  Sclerae unicteric No cervical or supraclavicular adenopathy Lungs no rales or rhonchi--ausculatated anterolaterally Heart regular rate and rhythm Abd obese, positive bowel sounds, NT to mild palpation MSK bilateral LE 2+ edema with chronic venostasis changes Neuro: nonfocal; well-oriented; appropriate affect   LAB RESULTS: Lab Results  Component Value Date   WBC 10.6* 04/21/2012   NEUTROABS 7.9* 04/20/2012   HGB 12.0* 04/21/2012   HCT 36.4*  04/21/2012   MCV 85.4 04/21/2012   PLT 198 04/21/2012    @LASTCHEMISTRY @  No results found for this basename: LABCA2    No components found with this basename: [id number]     Recent Labs Lab 04/20/12 1743  INR 1.49    Urinalysis No results found for this basename: colorurine, appearanceur, labspec, phurine, glucoseu, hgbur, bilirubinur, ketonesur, proteinur, urobilinogen, nitrite, leukocytesur    STUDIES:  Dg Ribs Unilateral W/chest Right  04/20/2012  *RADIOLOGY REPORT*  Clinical Data: Right lower rib pain, fell 1 week ago, shortness of breath  RIGHT RIBS AND CHEST - 3+ VIEW  Comparison: Chest x-ray of 03/19/2010  Findings: No active infiltrate or effusion is seen.  Mild cardiomegaly is stable.  Right rib detail films show no acute right rib fracture.  IMPRESSION:  1.  No active lung disease.  Mild cardiomegaly. 2.  Negative right rib detail.   Original Report Authenticated By: Dwyane Dee, M.D.    US Abdomen Complete  04/20/2012  *RADIOLOGY REPORT*  Clinical Data:  Abdominal pain, nausea and vomiting, morbid obesity, diabetes  COMPLETE ABDOMINAL ULTRASOUND  Comparison:  CT abdomen pelvis of 03/02/2010  Findings:  Gallbladder:  There is strong acoustical shadowing from the gallbladder consistent with multiple gallstones filling the gallbladder.  However there is no pain over gallbladder currently with compression.  Common bile duct:  The common bile duct is normal measuring 3.0 mm in diameter.  Liver:  The liver is somewhat inhomogeneous which may indicate mild fatty infiltration.  No focal abnormality is seen.  IVC:  Appears normal.  Pancreas:  The ultrasound technologist describes an inhomogeneous somewhat hypoechoic structure in the neck of the pancreas of 4.6 x 3.6 x 5.5 cm.  I did review the CT from February 2012 showing no abnormality.  Repeat CT of the abdomen using pancreatic protocol is recommended to evaluate for a possible pancreatic lesion.  Spleen:  The spleen is normal measuring  12.5 cm sagittally.  Right Kidney:  No hydronephrosis is seen.  The right kidney measures 12.0 cm sagittally.  Left Kidney:  No hydronephrosis is noted.  The left kidney measures 13.0 cm.  Abdominal aorta:  The abdominal aorta is obscured by bowel gas.  IMPRESSION:  1.  Multiple gallstones fill the gallbladder.  However there is no present evidence of acute cholecystitis by ultrasound. 2.  Hypoechoic rounded structure in the mid body - neck of the pancreas as described above.  Recommend CT of the abdomen using pancreatic protocol to evaluate further. 3.  Inhomogeneous liver may indicate mild fatty infiltration.   Original Report Authenticated By: Dwyane Dee, M.D.    Ct Abdomen Pelvis W Contrast  04/20/2012  *RADIOLOGY REPORT*  Clinical Data: Right-sided abdominal pain, nausea, known gallstones, history bladder carcinoma with treatment in 2012  CT ABDOMEN AND PELVIS WITH CONTRAST  Technique:  Multidetector CT imaging of the abdomen and pelvis was performed following the standard protocol during bolus administration of intravenous contrast.  Contrast: 50mL OMNIPAQUE IOHEXOL 300 MG/ML  SOLN, OMNIPAQUE IOHEXOL 300 MG/ML  SOLN  Comparison: CT abdomen pelvis of 03/02/2010  Findings: The lung bases are clear.  However there are multiple poorly defined low attenuation lesions scattered diffusely throughout the liver which appear new, consistent with diffuse liver metastases.  One of the larger of these lesions is within the lateral segment of the left lobe of liver anteriorly measuring 25 mm in diameter.  The gallbladder is contracted and there are faintly calcified gallstones present as noted by ultrasound.  The pancreatic head is fatty infiltrated.  However, there is a solid appearing mass within the mid body of the pancreas measuring 3.6 x 4.7 cm consistent with pancreatic body carcinoma.  The tail of the pancreas is somewhat atrophic and the pancreatic duct is not dilated.  The adrenal glands are unremarkable  and the spleen remains slightly prominent.  There are multiple lymph nodes surrounding the pancreatic head  with one of the larger nodes measuring 16 mm in short axis diameter consistent with metastatic disease.  Multiple mesenteric and retroperitoneal lymph nodes are present.  The kidneys enhance with no focal abnormality and no calculi are seen.  On delayed images, the pelvocaliceal systems are unremarkable.  The abdominal aorta is normal in caliber.  IMPRESSION: 1.  3.6 x 4.7 cm mass in the mid body of the pancreas consistent with pancreatic carcinoma.  2.  Multiple diffuse liver metastases.  3.  Peripancreatic as well as retroperitoneal and mesenteric adenopathy.   Original Report Authenticated By: Dwyane Dee, M.D.    US Biopsy  04/21/2012  *RADIOLOGY REPORT*  Clinical data:  Pancreatic mass with multiple liver lesions  ULTRASOUND-GUIDED CORE LIVER LESION BIOPSY  Comparison:  CT 04/20/2012  Technique and findings: The procedure, risks (including but not limited to bleeding, infection, organ damage), benefits, and alternatives were explained to the patient.  Questions regarding the procedure were encouraged and answered.  The patient understands and consents to the procedure.Survey ultrasound of the liver was performed and a representative left lobe lesion was localized.  An appropriate skin entry site was marked, Operator donned sterile gloves and mask.   Site was marked, prepped with Betadine, draped in usual sterile fashion, infiltrated locally with 1% lidocaine.  Intravenous Fentanyl and Versed were administered as conscious sedation during continuous cardiorespiratory monitoring by the radiology RN, with a total moderate sedation time of 10 minutes.  Under real time ultrasound guidance, a 17 gauge trocar needle was advanced to the margin of the lesion.  Once needle tip position was confirmed, coaxial 18-gauge core biopsy samples were obtained, submitted in formalin to  surgical pathology.  The guide needle  was removed.  Postprocedure scans show no hematoma or other apparent complication. The patient tolerated the procedure well.  IMPRESSION: 1.  Technically successful ultrasound-guided core biopsy of   liver lesion   Original Report Authenticated By: D. 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 He understands I do not believe this is related to his earlier bladder cancer. We went over the unfortunate fact that stage IV pancreatic cancer is not curable. We can perhaps prolong his life some months with chemotherapy, but that brings its own share of problems and we discussed some of the main side effects of the simplest chemotherapy we could use that has any chance of benefiting him, namely gemcitabine.  After this discussion [name] expressed an interest in Hospice care. He has suffered long and his life has been hard; he is "ready to go." He seems clear that he wants quality before quantity, and in particular he wants to be made comfortable in his final weeks.  Accordingly I have placed a palliative care consult. I have recommended [facility] for him. I believe his prognosis with no cancer-specific treatment is less than three months.   If the patient changes his mind and wishes to be treated, Dr Truett Perna is our gastro-intestinal cancer specialist and I have asked him to save a new patient slot for [name] on May 5 (his earliest available). However if the patient prefers the palliative option, I will be glad to be his MD for Hospice purposes.  Appreciate  this consult!   Nahlia Hellmann C    04/21/2012

## 2012-04-21 NOTE — Telephone Encounter (Signed)
PT SCHEDULE PER GINA.

## 2012-04-22 DIAGNOSIS — J449 Chronic obstructive pulmonary disease, unspecified: Secondary | ICD-10-CM

## 2012-04-22 DIAGNOSIS — E119 Type 2 diabetes mellitus without complications: Secondary | ICD-10-CM

## 2012-04-22 DIAGNOSIS — C259 Malignant neoplasm of pancreas, unspecified: Secondary | ICD-10-CM

## 2012-04-22 DIAGNOSIS — R109 Unspecified abdominal pain: Secondary | ICD-10-CM

## 2012-04-22 LAB — COMPREHENSIVE METABOLIC PANEL
AST: 119 U/L — ABNORMAL HIGH (ref 0–37)
Albumin: 2.7 g/dL — ABNORMAL LOW (ref 3.5–5.2)
Alkaline Phosphatase: 279 U/L — ABNORMAL HIGH (ref 39–117)
BUN: 12 mg/dL (ref 6–23)
Chloride: 97 mEq/L (ref 96–112)
Creatinine, Ser: 0.74 mg/dL (ref 0.50–1.35)
Potassium: 3.6 mEq/L (ref 3.5–5.1)
Total Bilirubin: 6.5 mg/dL — ABNORMAL HIGH (ref 0.3–1.2)
Total Protein: 6.3 g/dL (ref 6.0–8.3)

## 2012-04-22 LAB — GLUCOSE, CAPILLARY
Glucose-Capillary: 141 mg/dL — ABNORMAL HIGH (ref 70–99)
Glucose-Capillary: 96 mg/dL (ref 70–99)

## 2012-04-22 LAB — CBC
HCT: 37.5 % — ABNORMAL LOW (ref 39.0–52.0)
MCHC: 32.5 g/dL (ref 30.0–36.0)
Platelets: 227 10*3/uL (ref 150–400)
RDW: 15.4 % (ref 11.5–15.5)
WBC: 11.7 10*3/uL — ABNORMAL HIGH (ref 4.0–10.5)

## 2012-04-22 NOTE — Progress Notes (Signed)
Contacted by RN that patient does not want to wear his CPAP because he says it is making a chirping noise . RN is putting him back on his nasal cannula. CPAP was making a normal noise at patient bedside when I helped him put it on.

## 2012-04-22 NOTE — Progress Notes (Signed)
[facility] PROGRESS NOTE  [name] JXB:[id number] DOB: Apr 23, 1950 DOA: 04/20/2012  PCP: Aida Puffer, MD  Brief HPI: [name] is a 62 y.o. male, with history of morbid obesity, diabetes mellitus type 2 insulin-dependent, COPD on home oxygen, history of bladder cancer in the past, who has had chronic abdominal pain for the last 2-3 years which has been getting worse for the last 3 months, also described 120 pounds of unintentional weight loss in the last 3 months, patient also stated that about 5 days ago he suffered a trip and fall at home and hurt his right upper quadrant and since then his epigastric abdominal pain and right upper quadrant abdominal pain has been worse. He described pain as sharp with at times radiating to the back, worse with food better with bowel rest and pain medications, no associated symptoms. In the ER workup suggestive of pancreatic mass on CT scan, transaminitis with elevated total bilirubin, I was asked to admit the patient for possible pancreatic cancer with metastases to the liver.  Past medical history:  Past Medical History  Diagnosis Date  . Diabetes mellitus without complication   . COPD (chronic obstructive pulmonary disease)   . Cancer     bladder    Consultants: Interventional radiology for biopsy [facility] and palliative care  Procedures: US guided Biopsy to be attempted 4/15  Antibiotics: Penicillin V from home  Subjective: Patient doing okay. Currently pain under control. No nausea. He feels very overwhelmed by his diagnosis.  Objective: Vital Signs  Filed Vitals:   04/21/12 1800 04/21/12 1932 04/22/12 0546 04/22/12 0613  BP: 155/50 146/55 149/79   Pulse: 73 82 89   Temp: 98.4 F (36.9 C) 98.3 F (36.8 C) 98.3 F (36.8 C)   TempSrc: Oral Oral Oral   Resp: 18 16 18    Height:      Weight:    180.804 kg (398 lb 9.6 oz)  SpO2: 94% 93% 92%     Intake/Output Summary (Last 24 hours) at 04/22/12  1342 Last data filed at 04/22/12 1337  Gross per 24 hour  Intake   1360 ml  Output      0 ml  Net   1360 ml   Filed Weights   04/20/12 1835 04/22/12 0613  Weight: 186.882 kg (412 lb) 180.804 kg (398 lb 9.6 oz)    General appearance: alert, cooperative, no distress and morbidly obese Resp: Decreased air entry at the bases. No crackles. Cardio: regular rate and rhythm, S1, S2 normal, no murmur, click, rub or gallop GI: Morbidly obese, Tender diffusely without rebound rigidity. BS present. No masses appreciated on this limited exam. Extremities: Chronic venous stasis noted both legs.   Lab Results:  Basic Metabolic Panel:  Recent Labs Lab 04/20/12 1111 04/21/12 0410 04/22/12 0349  NA 134* 136 136  K 3.6 3.8 3.6  CL 96 99 97  CO2 26 27 29   GLUCOSE 90 121* 130*  BUN 10 10 12   CREATININE 0.67 0.70 0.74  CALCIUM 9.2 8.2* 8.5   Liver Function Tests:  Recent Labs Lab 04/20/12 1111 04/22/12 0349  AST 143* 119*  ALT 132* 108*  ALKPHOS 302* 279*  BILITOT 5.2* 6.5*  PROT 6.4 6.3  ALBUMIN 2.8* 2.7*    Recent Labs Lab 04/20/12 1111  LIPASE 20   CBC:  Recent Labs Lab 04/20/12 1111 04/21/12 0410 04/22/12 0349  WBC 11.3* 10.6* 11.7*  NEUTROABS 7.9*  --   --   HGB 12.7* 12.0* 12.2*  HCT  38.2* 36.4* 37.5*  MCV 84.9 85.4 85.8  PLT 201 198 227   CBG:  Recent Labs Lab 04/21/12 1206 04/21/12 1639 04/21/12 2054 04/22/12 0751 04/22/12 1202  GLUCAP 121* 114* 156* 141* 96       Studies/Results: Ct Abdomen Pelvis W Contrast  04/20/2012  .  IMPRESSION: 1.  3.6 x 4.7 cm mass in the mid body of the pancreas consistent with pancreatic carcinoma.  2.  Multiple diffuse liver metastases.  3.  Peripancreatic as well as retroperitoneal and mesenteric adenopathy.   Original Report Authenticated By: Dwyane Dee, M.D.    US Biopsy  04/21/2012   IMPRESSION: 1.  Technically successful ultrasound-guided core biopsy of   liver lesion   Original Report Authenticated By: D.  Andria Rhein, MD     Medications:  Scheduled: . allopurinol  300 mg Oral Daily  . antiseptic oral rinse  15 mL Mouth Rinse BID  . furosemide  80 mg Oral Daily  . heparin  5,000 Units Subcutaneous Q8H  . HYDROcodone-acetaminophen  1 tablet Oral TID  . insulin aspart  0-9 Units Subcutaneous TID WC  . insulin aspart  20 Units Subcutaneous TID AC  . insulin detemir  10 Units Subcutaneous QPM  . ketoconazole  1 application Topical Daily  . penicillin v potassium  500 mg Oral TID  . polyethylene glycol  17 g Oral Daily  . potassium chloride  10 mEq Oral Daily  . zinc oxide  1 application Topical BID   Continuous:  JXB:[id number], ALPRAZolam, alum & mag hydroxide-simeth, guaiFENesin-dextromethorphan, HYDROcodone-acetaminophen, morphine injection, ondansetron (ZOFRAN) IV, ondansetron, polyethylene glycol, promethazine  Assessment/Plan:  Principal Problem:   Pancreatic cancer metastasized to liver Active Problems:   Diabetes mellitus without complication   COPD (chronic obstructive pulmonary disease)   Abdominal pain    Pancreatic Mass with Liver Lesions Metastatic pancreatic cancer. Oncology met with patient and discussed his options-he is stage IV. He likely has about 4-6 months to live. Palliative care involved. Plan is for goals of care meeting tomorrow afternoon and then discharged home after  COPD on home oxygen Stable. He is not wheezing nor is he short of breath. Nebulizer treatments on a needed basis, oxygen to be continued at 2 L nasal cannula.   Pannus cellulitis, left lower leg venous stasis ulcer, massive lymphedema in the right lower extremity Continue with home antibiotic for now. Nystatin cream will be continued. Local wound care. Monitor clinically.   Diabetes mellitus type 2, insulin-dependent Continue with home insulin at lower dose. HBA1c is 6.6. SSI.  Morbid Obesity   Stable. Reports weight loss as mentioned earlier.  History of Gout Stable  DVT  Prophylaxis: Heparin  Family Communication: Discussed with patient and his wife at bedside. Code Status: Full Code Disposition Plan: Home with hospice after goals of care meeting tomorrow    LOS: 2 days   Hollice Espy  [facility] Pager [phone] 04/22/2012, 1:42 PM  If 8PM-8AM, please contact night-coverage at [email or web address], password [facility]

## 2012-04-22 NOTE — Care Management Note (Addendum)
Cm received phone call from Dr.Taylor asking CM to offer pt & spouse home hospice choice prior to GOC. CM spent over 20 minutes in patient's room with family present explaining home hospice. CM informed pt & family once hospice choice made Rep from Hospice agency will further contact pt & family to answer questions. Pt's spouse not present during CM consult. Pt provided Cm with permission to contact spouse. No choice made at this time.   Tymeeja Coreena Rubalcava,RN,BSN [phone]

## 2012-04-22 NOTE — Progress Notes (Signed)
Thank you for consulting the Palliative Medicine Team at [facility] to meet your patient's and family's needs.   The reason that you asked [place] to see your patient is  For  GOC  We have scheduled your patient for a meeting: 04/23/2012 family request 5 pm   The Surrogate decision make is: Patient and spouse Contact information: [phone]  Other family members that need to be present:  Invited by spouse,  Renato Gails    Your patient is able/unable to participate: Yes  Additional Narrative:  Patient's spouse has stated that they would want hospice at home.  We will request preliminary information on which hospice they would desire and solidify goals of care at this meeting so that if he is able to be discharged we will have the right information if goals continue to support Home with Hospice.

## 2012-04-22 NOTE — Progress Notes (Signed)
Patient [name]      DOB: March 12, 1950      AVW:[id number]   Consult for Palliative Medicine consult for GOC received.  Spoke with [name].  He really would like his wife, pastor and possibly his step mother present for GOC.  His Wife [name] is going to coordinate a time and call us back as soon as possible.  Her phone number is [phone].   Will update the chart when time established.  Daniel Littlepage L. Ladona Ridgel, MD MBA The Palliative Medicine Team at [facility] Phone: [phone] Pager: [phone]

## 2012-04-22 NOTE — Progress Notes (Addendum)
Pt was offered assistance with cpap tonight.  Pt refuses to wear hospital-provided cpap anymore.  Pt  stated that he has "wrestled with it the past two nights, it makes too much noise and the pressures are too much".  I explained the reasoning and importance of the exhalation port being open and offered to adjust cpap pressure to his comfort, but he still refuses.  RN aware.  Pt remains on nasal cannula.  Pt was advised that RT available all night should he change his mind.

## 2012-04-22 NOTE — Progress Notes (Signed)
Patient stated that he did not want to "fight" his CPAP mask anymore. RRT notified and patient placed back on [place].

## 2012-04-23 ENCOUNTER — Encounter (HOSPITAL_COMMUNITY): Payer: Self-pay

## 2012-04-23 DIAGNOSIS — C259 Malignant neoplasm of pancreas, unspecified: Secondary | ICD-10-CM

## 2012-04-23 DIAGNOSIS — J449 Chronic obstructive pulmonary disease, unspecified: Secondary | ICD-10-CM

## 2012-04-23 DIAGNOSIS — R109 Unspecified abdominal pain: Secondary | ICD-10-CM

## 2012-04-23 DIAGNOSIS — E119 Type 2 diabetes mellitus without complications: Secondary | ICD-10-CM

## 2012-04-23 LAB — GLUCOSE, CAPILLARY
Glucose-Capillary: 110 mg/dL — ABNORMAL HIGH (ref 70–99)
Glucose-Capillary: 111 mg/dL — ABNORMAL HIGH (ref 70–99)
Glucose-Capillary: 110 mg/dL — ABNORMAL HIGH (ref 70–99)

## 2012-04-23 MED ORDER — ALUM & MAG HYDROXIDE-SIMETH 200-200-20 MG/5ML PO SUSP: 30.0000 mL | Freq: Four times a day (QID) | ORAL | Status: AC | PRN

## 2012-04-23 MED ORDER — HYDROCODONE-ACETAMINOPHEN 10-325 MG PO TABS: 1.0000 | ORAL_TABLET | Freq: Three times a day (TID) | ORAL | Status: DC

## 2012-04-23 MED ORDER — PANCRELIPASE (LIP-PROT-AMYL) 12000-38000 UNITS PO CPEP
1.0000 | ORAL_CAPSULE | Freq: Three times a day (TID) | ORAL | Status: DC
  Administered 2012-04-23 – 2012-04-25 (×6): 1 via ORAL
  Filled 2012-04-23 (×8): qty 1

## 2012-04-23 MED ORDER — POLYETHYLENE GLYCOL 3350 17 G PO PACK: 17.0000 g | PACK | Freq: Every day | ORAL | Status: AC | PRN

## 2012-04-23 MED ORDER — OXYCODONE HCL 5 MG PO TABS
5.0000 mg | ORAL_TABLET | Freq: Four times a day (QID) | ORAL | Status: DC
  Administered 2012-04-23: 5 mg via ORAL

## 2012-04-23 MED ORDER — ALBUTEROL SULFATE (5 MG/ML) 0.5% IN NEBU: 2.5000 mg | INHALATION_SOLUTION | RESPIRATORY_TRACT | Status: DC | PRN

## 2012-04-23 MED ORDER — INSULIN DETEMIR 100 UNIT/ML ~~LOC~~ SOLN: 20.0000 [IU] | Freq: Every evening | SUBCUTANEOUS | Status: AC

## 2012-04-23 MED ORDER — PROMETHAZINE HCL 12.5 MG PO TABS: 12.5000 mg | ORAL_TABLET | Freq: Four times a day (QID) | ORAL | Status: AC | PRN

## 2012-04-23 MED ORDER — ONDANSETRON HCL 4 MG PO TABS: 4.0000 mg | ORAL_TABLET | Freq: Four times a day (QID) | ORAL | Status: AC | PRN

## 2012-04-23 MED ORDER — OXYCODONE HCL 5 MG PO TABS
5.0000 mg | ORAL_TABLET | ORAL | Status: DC | PRN
  Filled 2012-04-23: qty 1

## 2012-04-23 NOTE — Consult Note (Signed)
Patient [name]      DOB: 1950-09-06      JXB:[id number]     Consult Note from the Palliative Medicine Team at [facility]    Consult Requested by:  Dr. Ferne Reus   PCP: Aida Puffer, MD Reason for Consultation:GOC and    Phone [phone] Related symptom rec Assessment of patients Current state: Patient is a 62 year old white male with a known past medical history for chronic back and abdominal pain. He was admitted with increasing pain which was impacting on his ability to ambulate. During this admission he was diagnosed with a pancreatic mass which underwent biopsy. 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 Patient has been offered chemotherapeutic evaluation/treatment. At this time his goals of care related to his prognosis are to return to home to spend time with this family and get his affairs in order. He does not want to participate in chemotherapy as he feels with stage IV disease that would not be logical. He would prefer to use this time at home and coming back and forth for treatment. He was able to review his advanced directives with me but at this time he feels that he needs to talk with his wife separately from our meeting as she has mental health issues there are concerning to him. He would like to remain a full code at this time but understand that the hospice team can help him with a change in status at any time. Overall he states that his goals are to be as comfortable as possible he is accepting of his prognosis of weeks to months. He states that when the time comes he wants his family to respect his wishes to discontinue standard therapy for his chronic medical illnesses and pursue full comfort care. At this time his goals are to maintain treatment for his chronic medical conditions while he spends time with his family to his affairs and order. At some point, he feels that he will transition to a full comfort mode and potentially  discontinue treatments like his diabetes monitoring et Karie Soda. He is aware that he can transition to residential hospice home and the time is appropriate for care unit setting as he feels that being at home will be too much of a burden for his wife. Both parties are in agreement to initiate hospice at the time of discharge so that he has a support system for transitioning him to this interview.   Goals of Care: 1.  Code Status: Full code at this time. The patient understands that he can make a decision for DO NOT RESUSCITATE at any time and we discussed the pros and cons of remaining full code in light of his current situation.   2. Scope of Treatment: Continued curative treatment in the form of monitoring and treating his chronic illnesses. He desires to adjust his pain medication to optimize his comfort. He is unable/willing at this time to complete a most form but we did discuss each of the points on the most form. I encouraged hospice team to reevaluate his goals of care at the time of admission, and participating ongoing conversations with him regarding the mattress.  4. Disposition: Home with hospice   3. Symptom Management:   1. Anxiety/Agitation: Continue the patient's Xanax for anxiety 2. Pain: At this time I would like to titrate oral medications for his chronic pain which has been difficult to control. Patient states that he has used oxycodone in the past for a dental procedure which  he felt was a little bit too strong for the procedure but was willing to attempt to change from his hydrocodone which is not meeting his needs. We'll trial oxycodone 5 mg scheduled to 6 hours with breakthrough when necessary medications and adjust as needed. 3. Bowel Regimen: 4. Currently taking MiraLAX with good results would continue 5 .  Sleep apnea would continue to encourage the patient to use CPAP for comfort. 6. Jaundice secondary to metastatic disease treadmill avoid Tylenol products  4.  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 He is married to his wife [name] for the last 36 years and has good relationships with his step mother and step brother and sister. He has good family support. He is concerned about his wife's mental health issues and is sensitive to the fact that she had had significant difficulty with the death of her mother and father.  5. Spiritual: patient describes himself as a man of Faith is supported by his pastor. He states he is not afraid to die, but is concerned about his wife's health.        Patient Documents Completed or Given: Document Given Completed  Advanced Directives Pkt    MOST    DNR    Gone from My Sight    Hard Choices      Brief HPI: 62 year-old white male with a past medical her chronic pain now found to have a pancreatic lesion with metastatic processes in the liver. We've been asked to assist with goals of care and related symptom recommendations as well as hospice disposition   ROS: Right upper cord and tenderness/throbbing sensation, intermittent back and epigastric pain. Right lower quadrant abdominal discomfort, no diarrhea no nausea no vomiting decreased appetite. Significant weight loss    PMH:  Past Medical History  Diagnosis Date  . Diabetes mellitus without complication   . COPD (chronic obstructive pulmonary disease)   . Cancer     bladder  . Palliative care encounter      ZOX:WRUEAVW reviewed. No pertinent past surgical history. I have reviewed the FH and SH and  If appropriate update it with new information. Allergies  Allergen Reactions  . Epinephrine   . Keflex (Cephalexin)    Scheduled Meds: . allopurinol  300 mg Oral Daily  . antiseptic oral rinse  15 mL Mouth Rinse BID  . furosemide  80 mg Oral Daily  . heparin  5,000 Units Subcutaneous Q8H  . HYDROcodone-acetaminophen  1 tablet Oral TID  . insulin aspart  0-9 Units Subcutaneous  TID WC  . insulin aspart  20 Units Subcutaneous TID AC  . insulin detemir  10 Units Subcutaneous QPM  . ketoconazole  1 application Topical Daily  . lipase/protease/amylase  1 capsule Oral TID AC  . oxyCODONE  5 mg Oral Q6H  . penicillin v potassium  500 mg Oral TID  . polyethylene glycol  17 g Oral Daily  . potassium chloride  10 mEq Oral Daily  . zinc oxide  1 application Topical BID   Continuous Infusions:  PRN Meds:.albuterol, ALPRAZolam, alum & mag hydroxide-simeth, guaiFENesin-dextromethorphan, HYDROcodone-acetaminophen, morphine injection, ondansetron (ZOFRAN) IV, ondansetron, oxyCODONE, polyethylene glycol, promethazine    BP 134/57  Pulse 71  Temp(Src) 98.5 F (36.9 C) (Oral)  Resp 18  Ht 6' (1.829 m)  Wt 179.67 kg (396 lb 1.6 oz)  BMI 53.71 kg/m2  SpO2 94%   PPS: 40-50%   Intake/Output Summary (Last 24  hours) at 04/23/12 1911 Last data filed at 04/23/12 1334  Gross per 24 hour  Intake   1110 ml  Output      0 ml  Net   1110 ml   LBM 04/23/2012                       Stool Softner: On MiraLAX  Physical Exam:  General: no acute distress slightly overwhelmed with her meeting, but able to make choices for himself at this time HEENT:  Pupils are equal round and reactive to light, extraocular muscles appear to be intact his membranes are moist dentition is poor Chest:   Decreased but clear to auscultation his blockers or wheezes CVS:  Regular rate and rhythm positive S1 and S2 no S3-S4 murmurs or gallops Abdomen: morbidly obese Ext:  Hyperpigmented areas consistent with venous stasis, some bulla on the left leg Neuro: awake alert oriented cranial nerves II through XII are intact, power is 5 over 5  Labs: CBC    Component Value Date/Time   WBC 11.7* 04/22/2012 0349   RBC 4.37 04/22/2012 0349   HGB 12.2* 04/22/2012 0349   HCT 37.5* 04/22/2012 0349   PLT 227 04/22/2012 0349   MCV 85.8 04/22/2012 0349   MCH 27.9 04/22/2012 0349   MCHC 32.5 04/22/2012 0349   RDW 15.4  04/22/2012 0349   LYMPHSABS 1.6 04/20/2012 1111   MONOABS 1.4* 04/20/2012 1111   EOSABS 0.4 04/20/2012 1111   BASOSABS 0.1 04/20/2012 1111       CMP     Component Value Date/Time   NA 136 04/22/2012 0349   K 3.6 04/22/2012 0349   CL 97 04/22/2012 0349   CO2 29 04/22/2012 0349   GLUCOSE 130* 04/22/2012 0349   BUN 12 04/22/2012 0349   CREATININE 0.74 04/22/2012 0349   CALCIUM 8.5 04/22/2012 0349   PROT 6.3 04/22/2012 0349   ALBUMIN 2.7* 04/22/2012 0349   AST 119* 04/22/2012 0349   ALT 108* 04/22/2012 0349   ALKPHOS 279* 04/22/2012 0349   BILITOT 6.5* 04/22/2012 0349   GFRNONAA >90 04/22/2012 0349   GFRAA >90 04/22/2012 0349    Chest Xray Reviewed/Impressions: Cardiomegaly with no acute abnormality  CT scan of the abdomen Reviewed/Impressions: 3.6 x 4.7 mass in the mid body of the pancreas consistent with pancreatic carcinoma, multiple diffuse metastatic lesions, and peripancreatic as well as retroperitoneal mesenteric adenopathy    Time In Time Out Total Time Spent with Patient Total Overall Time  5 PM   7 PM   120 minutes   120 minutes     Greater than 50%  of this time was spent counseling and coordinating care related to the above assessment and plan.  Discussed with Dr. Watt Climes L. Ladona Ridgel, MD MBA The Palliative Medicine Team at [facility] Phone: [phone] Pager: [phone]

## 2012-04-23 NOTE — Progress Notes (Signed)
Notified by [name] that patient's wife [name] contacted their office this morning and stated that should, after the PMT meeting this afternoon, the decision be to return home with hospice they would like to request services of [facility]; this RN contacted wife [name] and confirmed above  - [facility] is aware of the possibility of this referral; discussed with wife [name] on phone and later with wife and patient in room (also present with patient agreement- patient's sister, [name], friend [name] and step-mother) initial information related to services, philosophy and team approach to care- wife and patient voiced undertstanding; -per wife there are no DME needs at this time; patient has a specially made bed and wheel chair in the home; he has O2 and a CPAP machine from [employer] in the home; wife and patient stated they may be able to use personal vehicle to transport home, if patient is able  -patient and wife voiced concern about patient's current level of pain control and how this will be handled at home; they were encouraged to further discuss this, and other goals, with PMT provider this afternoon  M. Pearson, CMRN covering for [facility] aware that as of this note we are still awaiting confirmation from PMT meeting regarding patient's wishes- As [facility] offices are closed due to agency holiday, tomorrow, Friday, Adela Ports [facility] and wife given [facility] on-call patient number [phone] to call for information/assistance    Valente David, RN 04/23/2012, 5:12 PM [facility] Palliative Medicine Team RN Liaison [phone]

## 2012-04-23 NOTE — Progress Notes (Addendum)
Patient ZO:XWRUE Acuna      DOB: 05-23-1950      AVW:[id number]   Summary of goals of care;full note to follow:   Met with patient , his wife [name], Step Mother Kathie Rhodes, sister Bonita Quin, brother Kathlene November, and sister Darl Pikes on the phone along with Brother in Social worker and sister in laws and a family friend.   Chanler expressed to his family that he is tired of hurting and does not want to prolong his suffering.  He loves them enough to tell them that he wants to spend as much time with them as he can and get his affairs in order , and yet when he says it is his time he wants them to honor his decisions.  He understands that he has a choice to seek treatment through the oncology center but states that he doesn't want to use his time receiving treatment that will not give him quality time with his family.  He states he will not be seeking chemo appointment.  He is agreeable to having hospice start with him at home understanding that he can transition to [facility] if he is unable to maintain his comfort and dignity at home.   We talked about his code status and feeding preferences.  He is holding back on making these choice because his wife has mental health issues and he wants to work with her on the decisions so that she feels she is a part of this.  He overall expresses that he would likely not want to undergo CPR nor have artificial feeding but he wanted to confirm this with his wife with less of an audience.  He knows what a DNR is and represents and will ask for assistance from his healthcare team when he is ready to make the change.   Symptom management: Ector states that his pain is no longer responsive to oral hydrocodone.  He relates that he would like to try to balance being out of pain with being alert and interactive.  He relates that his pain gets worse right after he eats which might be help by using pancreatic enzymes.  Discussed with Dr. Rito Ehrlich  Will trial pancreas enzymes with meals , and convert  to scheduled oxycodone, with prn for breakthrough.  Will try to avoid the tylenol due to his liver involvement.    Because of the Holidays.   The discharging team may need to provide the prescriptions ahead of time so that his wife can fill his medications at the [facility] on [address].  His pharmacy in [place] has limited hours.   Currently, [facility] is giving me permission to start hospice from day one and I have alerted them to the need for this service. See instructions for alerting Hospice at the time of discharge in Romoland Lester's note 4/17   Will follow up in am on patient for adjusting pain meds.     Total time with patient and family 500 pm -700 pm  Azaria Stegman L. Ladona Ridgel, MD MBA The Palliative Medicine Team at [facility] Phone: [phone] Pager: [phone]

## 2012-04-23 NOTE — Progress Notes (Signed)
[facility] PROGRESS NOTE  [name] WUJ:[id number] DOB: 02-23-1950 DOA: 04/20/2012  PCP: Aida Puffer, MD  Brief HPI: [name] is a 62 y.o. male, with history of morbid obesity, diabetes mellitus type 2 insulin-dependent, COPD on home oxygen, history of bladder cancer in the past, who has had chronic abdominal pain for the last 2-3 years which has been getting worse for the last 3 months, also described 120 pounds of unintentional weight loss in the last 3 months, patient also stated that about 5 days ago he suffered a trip and fall at home and hurt his right upper quadrant and since then his epigastric abdominal pain and right upper quadrant abdominal pain has been worse. He described pain as sharp with at times radiating to the back, worse with food better with bowel rest and pain medications, no associated symptoms. In the ER workup suggestive of pancreatic mass on CT scan, transaminitis with elevated total bilirubin, I was asked to admit the patient for possible pancreatic cancer with metastases to the liver.  Past medical history:  Past Medical History  Diagnosis Date  . Diabetes mellitus without complication   . COPD (chronic obstructive pulmonary disease)   . Cancer     bladder  . Palliative care encounter     Consultants: Interventional radiology for biopsy [facility]  Procedures: US guided Biopsy to be attempted 4/15  Antibiotics: Penicillin V from home  Subjective: Patient doing a little bit better today. He and I talked for a while and he is making peace with this diagnosis.  Objective: Vital Signs  Filed Vitals:   04/22/12 1401 04/22/12 2100 04/23/12 0524 04/23/12 1333  BP: 150/63 132/57 126/63 134/57  Pulse: 71 73 66 71  Temp: 98.5 F (36.9 C) 98.7 F (37.1 C) 99 F (37.2 C) 98.5 F (36.9 C)  TempSrc: Oral Oral Oral Oral  Resp: 16 18 18 18   Height:      Weight:   179.67 kg (396 lb 1.6 oz)   SpO2: 95%  93% 93% 94%    Intake/Output Summary (Last 24 hours) at 04/23/12 1416 Last data filed at 04/23/12 1334  Gross per 24 hour  Intake   1230 ml  Output      0 ml  Net   1230 ml   Filed Weights   04/20/12 1835 04/22/12 0613 04/23/12 0524  Weight: 186.882 kg (412 lb) 180.804 kg (398 lb 9.6 oz) 179.67 kg (396 lb 1.6 oz)    General appearance: alert, cooperative, no distress and morbidly obese Resp: Decreased air entry at the bases. No crackles. Cardio: regular rate and rhythm, S1, S2 normal, no murmur, click, rub or gallop GI: Morbidly obese, Tender diffusely without rebound rigidity. BS present. No masses appreciated on this limited exam. Extremities: Chronic venous stasis noted both legs.   Lab Results:  Basic Metabolic Panel:  Recent Labs Lab 04/20/12 1111 04/21/12 0410 04/22/12 0349  NA 134* 136 136  K 3.6 3.8 3.6  CL 96 99 97  CO2 26 27 29   GLUCOSE 90 121* 130*  BUN 10 10 12   CREATININE 0.67 0.70 0.74  CALCIUM 9.2 8.2* 8.5   Liver Function Tests:  Recent Labs Lab 04/20/12 1111 04/22/12 0349  AST 143* 119*  ALT 132* 108*  ALKPHOS 302* 279*  BILITOT 5.2* 6.5*  PROT 6.4 6.3  ALBUMIN 2.8* 2.7*    Recent Labs Lab 04/20/12 1111  LIPASE 20   CBC:  Recent Labs Lab 04/20/12 1111 04/21/12 0410  04/22/12 0349  WBC 11.3* 10.6* 11.7*  NEUTROABS 7.9*  --   --   HGB 12.7* 12.0* 12.2*  HCT 38.2* 36.4* 37.5*  MCV 84.9 85.4 85.8  PLT 201 198 227   CBG:  Recent Labs Lab 04/22/12 1202 04/22/12 1709 04/22/12 2140 04/23/12 0755 04/23/12 1209  GLUCAP 96 85 92 110* 111*       Studies/Results: Ct Abdomen Pelvis W Contrast  04/20/2012  .  IMPRESSION: 1.  3.6 x 4.7 cm mass in the mid body of the pancreas consistent with pancreatic carcinoma.  2.  Multiple diffuse liver metastases.  3.  Peripancreatic as well as retroperitoneal and mesenteric adenopathy.   Original Report Authenticated By: Dwyane Dee, M.D.    US Biopsy  04/21/2012   IMPRESSION: 1.   Technically successful ultrasound-guided core biopsy of   liver lesion   Original Report Authenticated By: D. Andria Rhein, MD     Medications:  Scheduled: . allopurinol  300 mg Oral Daily  . antiseptic oral rinse  15 mL Mouth Rinse BID  . furosemide  80 mg Oral Daily  . heparin  5,000 Units Subcutaneous Q8H  . HYDROcodone-acetaminophen  1 tablet Oral TID  . insulin aspart  0-9 Units Subcutaneous TID WC  . insulin aspart  20 Units Subcutaneous TID AC  . insulin detemir  10 Units Subcutaneous QPM  . ketoconazole  1 application Topical Daily  . penicillin v potassium  500 mg Oral TID  . polyethylene glycol  17 g Oral Daily  . potassium chloride  10 mEq Oral Daily  . zinc oxide  1 application Topical BID   Continuous:  NWG:[id number], ALPRAZolam, alum & mag hydroxide-simeth, guaiFENesin-dextromethorphan, HYDROcodone-acetaminophen, morphine injection, ondansetron (ZOFRAN) IV, ondansetron, polyethylene glycol, promethazine  Assessment/Plan:  Principal Problem:   Pancreatic cancer metastasized to liver Active Problems:   Diabetes mellitus without complication   COPD (chronic obstructive pulmonary disease)   Abdominal pain    Pancreatic Mass with Liver Lesions Metastatic pancreatic cancer. Oncology met with patient and discussed his options-he is stage IV. He likely has about 4-6 months to live. Palliative care involved. Plan is for goals of care meeting tomorrow this afternoon. Will likely be discharged on Friday after set up equipment.  COPD on home oxygen Stable. He is not wheezing nor is he short of breath. Nebulizer treatments on a needed basis, oxygen to be continued at 2 L nasal cannula.   Pannus cellulitis, left lower leg venous stasis ulcer, massive lymphedema in the right lower extremity Continue with home antibiotic for now. Nystatin cream will be continued. Local wound care. Monitor clinically.   Diabetes mellitus type 2, insulin-dependent Continue with home insulin at  lower dose. HBA1c is 6.6. SSI.  Morbid Obesity   Stable. Reports weight loss as mentioned earlier.  History of Gout Stable  DVT Prophylaxis: Heparin  Family Communication: Discussed with patient and his wife at bedside. Code Status: Full Code Disposition Plan: Home with hospice after goals of care meeting tomorrow    LOS: 3 days   Hollice Espy  [facility] Pager [phone] 04/23/2012, 2:16 PM  If 8PM-8AM, please contact night-coverage at [email or web address], password [facility]

## 2012-04-23 NOTE — Progress Notes (Signed)
Patient continues to refuse the use of nocturnal CPAP. He understands that his home equipment may be brought in and used after it is checked for safety.

## 2012-04-24 DIAGNOSIS — R5381 Other malaise: Secondary | ICD-10-CM

## 2012-04-24 DIAGNOSIS — E119 Type 2 diabetes mellitus without complications: Secondary | ICD-10-CM

## 2012-04-24 DIAGNOSIS — R109 Unspecified abdominal pain: Secondary | ICD-10-CM

## 2012-04-24 DIAGNOSIS — C259 Malignant neoplasm of pancreas, unspecified: Secondary | ICD-10-CM

## 2012-04-24 DIAGNOSIS — J449 Chronic obstructive pulmonary disease, unspecified: Secondary | ICD-10-CM

## 2012-04-24 DIAGNOSIS — Z515 Encounter for palliative care: Secondary | ICD-10-CM

## 2012-04-24 LAB — GLUCOSE, CAPILLARY
Glucose-Capillary: 118 mg/dL — ABNORMAL HIGH (ref 70–99)
Glucose-Capillary: 93 mg/dL (ref 70–99)
Glucose-Capillary: 88 mg/dL (ref 70–99)
Glucose-Capillary: 146 mg/dL — ABNORMAL HIGH (ref 70–99)

## 2012-04-24 MED ORDER — MORPHINE SULFATE 15 MG PO TABS
7.5000 mg | ORAL_TABLET | Freq: Three times a day (TID) | ORAL | Status: DC
  Administered 2012-04-24 – 2012-04-25 (×4): 7.5 mg via ORAL
  Filled 2012-04-24 (×4): qty 1

## 2012-04-24 MED ORDER — MORPHINE SULFATE 15 MG PO TABS
7.5000 mg | ORAL_TABLET | ORAL | Status: DC | PRN
  Administered 2012-04-25: 7.5 mg via ORAL
  Filled 2012-04-24: qty 1

## 2012-04-24 MED ORDER — SALINE SPRAY 0.65 % NA SOLN
1.0000 | NASAL | Status: DC | PRN
  Administered 2012-04-24: 1 via NASAL
  Filled 2012-04-24: qty 44

## 2012-04-24 NOTE — Progress Notes (Signed)
[facility] PROGRESS NOTE  [name] FAO:[id number] DOB: 1950/07/16 DOA: 04/20/2012  PCP: Aida Puffer, MD  Brief HPI: [name] is a 62 y.o. male, with history of morbid obesity, diabetes mellitus type 2 insulin-dependent, COPD on home oxygen, history of bladder cancer in the past, who has had chronic abdominal pain for the last 2-3 years which has been getting worse for the last 3 months, also described 120 pounds of unintentional weight loss in the last 3 months, patient also stated that about 5 days ago he suffered a trip and fall at home and hurt his right upper quadrant and since then his epigastric abdominal pain and right upper quadrant abdominal pain has been worse. He described pain as sharp with at times radiating to the back, worse with food better with bowel rest and pain medications, no associated symptoms. In the ER workup suggestive of pancreatic mass on CT scan, transaminitis with elevated total bilirubin, I was asked to admit the patient for possible pancreatic cancer with metastases to the liver.  Past medical history:  Past Medical History  Diagnosis Date  . Diabetes mellitus without complication   . COPD (chronic obstructive pulmonary disease)   . Cancer     bladder  . Palliative care encounter     Consultants: Interventional radiology for biopsy [facility] and palliative care  Procedures: US guided Biopsy to be attempted 4/15  Antibiotics: Penicillin V from home  Subjective: Had a rough night.  C/O palpitations from pain medicine.    Objective: Vital Signs  Filed Vitals:   04/23/12 1333 04/23/12 2240 04/24/12 0635 04/24/12 1445  BP: 134/57 154/71 132/62 138/58  Pulse: 71 72 72 75  Temp: 98.5 F (36.9 C) 98.6 F (37 C) 98.2 F (36.8 C) 98.9 F (37.2 C)  TempSrc: Oral Oral Oral Oral  Resp: 18 18 16 18   Height:      Weight:   180.078 kg (397 lb)   SpO2: 94% 92% 95% 93%    Intake/Output Summary (Last 24 hours) at  04/24/12 1530 Last data filed at 04/24/12 0747  Gross per 24 hour  Intake    240 ml  Output      0 ml  Net    240 ml   Filed Weights   04/22/12 0613 04/23/12 0524 04/24/12 0635  Weight: 180.804 kg (398 lb 9.6 oz) 179.67 kg (396 lb 1.6 oz) 180.078 kg (397 lb)    General appearance: alert, cooperative, no distress and morbidly obese Resp: Decreased air entry at the bases. No crackles. Cardio: regular rate and rhythm, S1, S2 normal, no murmur, click, rub or gallop GI: Morbidly obese, Tender diffusely without rebound rigidity. BS present. No masses appreciated on this limited exam. Extremities: Chronic venous stasis noted both legs.   Lab Results:  Basic Metabolic Panel:  Recent Labs Lab 04/20/12 1111 04/21/12 0410 04/22/12 0349  NA 134* 136 136  K 3.6 3.8 3.6  CL 96 99 97  CO2 26 27 29   GLUCOSE 90 121* 130*  BUN 10 10 12   CREATININE 0.67 0.70 0.74  CALCIUM 9.2 8.2* 8.5   Liver Function Tests:  Recent Labs Lab 04/20/12 1111 04/22/12 0349  AST 143* 119*  ALT 132* 108*  ALKPHOS 302* 279*  BILITOT 5.2* 6.5*  PROT 6.4 6.3  ALBUMIN 2.8* 2.7*    Recent Labs Lab 04/20/12 1111  LIPASE 20   CBC:  Recent Labs Lab 04/20/12 1111 04/21/12 0410 04/22/12 0349  WBC 11.3* 10.6* 11.7*  NEUTROABS 7.9*  --   --  HGB 12.7* 12.0* 12.2*  HCT 38.2* 36.4* 37.5*  MCV 84.9 85.4 85.8  PLT 201 198 227   CBG:  Recent Labs Lab 04/23/12 1209 04/23/12 1853 04/23/12 2239 04/24/12 0746 04/24/12 1207  GLUCAP 111* 110* 118* 93 113*       Studies/Results: Ct Abdomen Pelvis W Contrast  04/20/2012  .  IMPRESSION: 1.  3.6 x 4.7 cm mass in the mid body of the pancreas consistent with pancreatic carcinoma.  2.  Multiple diffuse liver metastases.  3.  Peripancreatic as well as retroperitoneal and mesenteric adenopathy.   Original Report Authenticated By: Dwyane Dee, M.D.    US Biopsy  04/21/2012   IMPRESSION: 1.  Technically successful ultrasound-guided core biopsy of    liver lesion   Original Report Authenticated By: D. Andria Rhein, MD     Medications:  Scheduled: . allopurinol  300 mg Oral Daily  . antiseptic oral rinse  15 mL Mouth Rinse BID  . furosemide  80 mg Oral Daily  . heparin  5,000 Units Subcutaneous Q8H  . insulin aspart  0-9 Units Subcutaneous TID WC  . insulin aspart  20 Units Subcutaneous TID AC  . insulin detemir  10 Units Subcutaneous QPM  . ketoconazole  1 application Topical Daily  . lipase/protease/amylase  1 capsule Oral TID AC  . morphine  7.5 mg Oral Q8H  . penicillin v potassium  500 mg Oral TID  . polyethylene glycol  17 g Oral Daily  . potassium chloride  10 mEq Oral Daily  . zinc oxide  1 application Topical BID   Continuous:  ZOX:WRUEAVWUJ, ALPRAZolam, alum & mag hydroxide-simeth, guaiFENesin-dextromethorphan, morphine, ondansetron (ZOFRAN) IV, ondansetron, polyethylene glycol, promethazine  Assessment/Plan:  Principal Problem:   Pancreatic cancer metastasized to liver Active Problems:   Diabetes mellitus without complication   COPD (chronic obstructive pulmonary disease)   Abdominal pain    Pancreatic Mass with Liver Lesions Metastatic pancreatic cancer. Oncology met with patient and discussed his options-he is stage IV. He likely has about 4-6 months to live. Palliative care involved. Following goals of care meeting, working on establishing solid po regimen for pain.  Plan for DC tomorrow.  COPD on home oxygen Stable. He is not wheezing nor is he short of breath. Nebulizer treatments on a needed basis, oxygen to be continued at 2 L nasal cannula.   Pannus cellulitis, left lower leg venous stasis ulcer, massive lymphedema in the right lower extremity Continue with home antibiotic for now. Nystatin cream will be continued. Local wound care. Monitor clinically.   Diabetes mellitus type 2, insulin-dependent Continue with home insulin at lower dose. HBA1c is 6.6. SSI.  Morbid Obesity   Stable. Reports weight  loss as mentioned earlier.  History of Gout Stable  DVT Prophylaxis: Heparin   Family Communication: Discussed with patient and his wife at bedside.  Code Status: Full Code  Disposition Plan: Home with hospice after goals of care meeting tomorrow    LOS: 4 days   Hollice Espy  [facility] Pager [phone] 04/24/2012, 3:30 PM  If 8PM-8AM, please contact night-coverage at [email or web address], password [facility]

## 2012-04-24 NOTE — Progress Notes (Signed)
Pt complain to RN that the new ordered pain med " oxy ir made him feel bad" he does not want to take it anymore. Will make MD aware.

## 2012-04-24 NOTE — Progress Notes (Signed)
[name]   DOB:[birth year]   XB#:[id number]   ZHY#:[id number]  Subjective: Pride is comfortable, sitting up in bed, receiving his insulin; did not do well with "new" pain meds last night; currently denies pain, shortness of breath, or other acute symptoms; he remains weak, but is able to eat and drink "enough". No family in room   Objective: middle aged white male examined sitting at bedside Filed Vitals:   04/24/12 0635  BP: 132/62  Pulse: 72  Temp: 98.2 F (36.8 C)  Resp: 16    Body mass index is 53.83 kg/(m^2).  Intake/Output Summary (Last 24 hours) at 04/24/12 0805 Last data filed at 04/23/12 1334  Gross per 24 hour  Intake    720 ml  Output      0 ml  Net    720 ml     Sclerae mildly unicteric  Oropharynx clear  No peripheral adenopathy  Lungs no rales or rhonchi  Heart regular rate and rhythm  Abdomen obese, +BS  Neuro nonfocal, well-oriented, pleasant affect   CBG (last 3)   Recent Labs  04/23/12 1209 04/23/12 1853 04/24/12 0746  GLUCAP 111* 110* 93     Labs:  Lab Results  Component Value Date   WBC 11.7* 04/22/2012   HGB 12.2* 04/22/2012   HCT 37.5* 04/22/2012   MCV 85.8 04/22/2012   PLT 227 04/22/2012   NEUTROABS 7.9* 04/20/2012    @LASTCHEMISTRY @  Urine Studies No results found for this basename: UACOL, UAPR, USPG, UPH, UTP, UGL, UKET, UBIL, UHGB, UNIT, UROB, ULEU, UEPI, UWBC, URBC, UBAC, CAST, CRYS, UCOM, BILUA,  in the last 72 hours  Basic Metabolic Panel:  Recent Labs Lab 04/20/12 1111 04/21/12 0410 04/22/12 0349  NA 134* 136 136  K 3.6 3.8 3.6  CL 96 99 97  CO2 26 27 29   GLUCOSE 90 121* 130*  BUN 10 10 12   CREATININE 0.67 0.70 0.74  CALCIUM 9.2 8.2* 8.5   GFR Estimated Creatinine Clearance: 166.8 ml/min (by C-G formula based on Cr of 0.74). Liver Function Tests:  Recent Labs Lab 04/20/12 1111 04/22/12 0349  AST 143* 119*  ALT 132* 108*  ALKPHOS 302* 279*  BILITOT 5.2* 6.5*  PROT 6.4 6.3  ALBUMIN 2.8* 2.7*    Recent  Labs Lab 04/20/12 1111  LIPASE 20   No results found for this basename: AMMONIA,  in the last 168 hours Coagulation profile  Recent Labs Lab 04/20/12 1743  INR 1.49    CBC:  Recent Labs Lab 04/20/12 1111 04/21/12 0410 04/22/12 0349  WBC 11.3* 10.6* 11.7*  NEUTROABS 7.9*  --   --   HGB 12.7* 12.0* 12.2*  HCT 38.2* 36.4* 37.5*  MCV 84.9 85.4 85.8  PLT 201 198 227   Cardiac Enzymes: No results found for this basename: CKTOTAL, CKMB, CKMBINDEX, TROPONINI,  in the last 168 hours BNP: No components found with this basename: POCBNP,  CBG:  Recent Labs Lab 04/22/12 2140 04/23/12 0755 04/23/12 1209 04/23/12 1853 04/24/12 0746  GLUCAP 92 110* 111* 110* 93   D-Dimer No results found for this basename: DDIMER,  in the last 72 hours Hgb A1c No results found for this basename: HGBA1C,  in the last 72 hours Lipid Profile No results found for this basename: CHOL, HDL, LDLCALC, TRIG, CHOLHDL, LDLDIRECT,  in the last 72 hours Thyroid function studies No results found for this basename: TSH, T4TOTAL, FREET3, T3FREE, THYROIDAB,  in the last 72 hours Anemia work up No results found for  this basename: VITAMINB12, FOLATE, FERRITIN, TIBC, IRON, RETICCTPCT,  in the last 72 hours Microbiology No results found for this or any previous visit (from the past 240 hour(s)).    Studies:  No results found. Patient Name: [name], [name] Accession #: [id number] DOB: 1950/01/15 Age: 62 Gender: M Client Name [facility] Collected Date: 04/21/2012 Received Date: 04/21/2012 Physician: D. Oley Balm Chart #: MRN # : [id number] Physician cc: Race:W Visit #: [id number] REPORT OF SURGICAL PATHOLOGY FINAL DIAGNOSIS Diagnosis Liver, needle/core biopsy - ADENOCARCINOMA. Microscopic Comment The core biopsies are involved by adenocarcinoma consistent with metastatic adenocarcinoma. The morphologic features are compatible with metastatic pancreatic adenocarcinoma. (JDP:gt,  04/22/12) Daniel Picket MD Pathologist, Electronic Signature (Case signed 04/22/2012) Specimen Gross and Clinical Information Specimen(s) Obtained: Liver, needle/core biopsy Specimen Clinical Information panc mass, mets (kp) Gross Received in formalin are three cores of tan white soft tissue ranging from 2.1 cm in length x 0.1 cm in diameter to 3.0 cm in length x 0.1 cm in diameter. The specimen is entirely submitted in one cassette. (KL:caf 04/21/12) Report signed out from the following location(s) [facility] [address], [place], [place] [address]. CLIA #: [id number], 1 of  Assessment: 62 y.o. [place] man with multiple comorbidities, now with a pancreatic mass, multiple liver lesions, and a Ca-19 > 140,000; liver biopsy 04/21/2012 confirming stage IV disease  Plan: I again discussed [facility] with [name] and my suspicion is he will need transfer within a week-10 days, but patient is agreeable to giving it a go at home, partly because this transition is proving difficult for his wife. Unfortunately I have not been able to meet with her yet.   I advised the patient to be alert to changing needs, as if and when he decides he urgently needs transfer to [facility] there may be no bed available. If he does stay at home I will plan to see him in 3 weeks.   Appreciate Hospitalist's and palliative care's help to this patient!   MAGRINAT,GUSTAV C 04/24/2012

## 2012-04-24 NOTE — Progress Notes (Signed)
Patient [name]      DOB: 06/17/1950      HKV:[id number]   Palliative Medicine Team at [facility] Progress Note    Subjective:  Patient relates that he had a "bad" night .  State that he awoke feeling like his heart was racing noted that he continues to not use his CPAP at night . He attributes the feelings to using oxycodone.  He previous stated he was given oxycodone post oral surgery and stated he felt it was too strong for that type of pain, no relates he "felt this way when he took it before". Patient currently relates he is back to normal .  His current pain is described as a throbbing or aching pain in his liver.  He relates he did not notice improvement with taking the pancrease but it was not given to him before his me.  He agrees to try it again this am and see if it helps his postprandial pain.     Filed Vitals:   04/24/12 0635  BP: 132/62  Pulse: 72  Temp: 98.2 F (36.8 C)  Resp: 16   Physical exam:  Generally: in good spirits , no acute distress, ready to eat breakfast PERRL, EOMI.  Poor dentitition Chest good air entry posterior field, decreased at bases CVS:  Regular, S1, S2 Abd: obesed soft, tender right upper quadrant Ext: hyperpigmented,  Bulla left shin Neuro:  Awake , alert oriented      Assessment and plan: 62 yr old white male with recent diagnosis of pancreatic cancer , adenocarcinoma, Stage IV.  The patient has expressed his goals as desiring to not pursue chemo intervention , but to use the time that he has left being with his family.  He has been struggling with exerting his wishes for his end of life care ahead of his wife's wishes because she has some mental health issues that make her fragile.  Overall , they both agree to have hospice involved at discharge with the understanding that they can transition to residential hospice from home if and when needed. Patient relates an adverse reaction to oxycodone.  States pain in liver continuous .  1.   Full code status. For now.  2.  Pain : liver , back and epigastric.  Continue lipase enzymes.  Switch to Morphine IR 15 mg half of a tablet q 8 hours . Patient could take an additional half tablet for breakthrough pain  q4 hours if needed.  3.  Monitor bowel movements . Agree with continued miralax.  4.  Prognosis:  6 months or less, likely still weeks as he is eating and drinking reliably  Disposition: Home with hospice.  Hospice team should have low threshold for transition to [facility].  Total time 730-830 am  . Angeliz Settlemyre L. Ladona Ridgel, MD MBA The Palliative Medicine Team at [facility] Phone: [phone] Pager: [phone]

## 2012-04-24 NOTE — Progress Notes (Signed)
Called to patient bed side for complaints of increased SOB. VSS upon arrival with clear BBS. Patient states he has CHF and has not been using CPAP. Offered again to assist with set up. He continues to refuse. He states the pressure did not fee like his home unit and he is noncompliant to attempt a titration of settings. When asked if someone could bring his home equipment, he states "yes... But I am going home tomorrow". He is encouraged to call for assistance. Education provided and patient voices understanding, however, noncompliance continues. RN aware.

## 2012-04-25 ENCOUNTER — Encounter (HOSPITAL_COMMUNITY): Payer: Self-pay | Admitting: *Deleted

## 2012-04-25 LAB — GLUCOSE, CAPILLARY: Glucose-Capillary: 121 mg/dL — ABNORMAL HIGH (ref 70–99)

## 2012-04-25 MED ORDER — PANCRELIPASE (LIP-PROT-AMYL) 12000-38000 UNITS PO CPEP: 1.0000 | ORAL_CAPSULE | Freq: Three times a day (TID) | ORAL | Status: AC

## 2012-04-25 MED ORDER — MORPHINE SULFATE 15 MG PO TABS: 7.5000 mg | ORAL_TABLET | ORAL | Status: DC | PRN

## 2012-04-25 MED ORDER — MORPHINE SULFATE 15 MG PO TABS: 7.5000 mg | ORAL_TABLET | Freq: Three times a day (TID) | ORAL | Status: DC

## 2012-04-25 NOTE — Care Management (Signed)
   CARE MANAGEMENT NOTE 04/25/2012  Patient:  [name], [name]   Account Number:  [id number]  Date Initiated:  04/21/2012  Documentation initiated by:  DAVIS,TYMEEKA  Subjective/Objective Assessment:   62 yo male admitted with pancreatic lesions. PTA pr from [place]em with spouse.     Action/Plan:   Home when stable   Anticipated DC Date:  04/25/2012   Anticipated DC Plan:  HOME W HOSPICE CARE  In-house referral  NA      DC Planning Services  CM consult      PAC Choice  HOSPICE   Choice offered to / List presented to:  C-1 Patient   DME arranged  NA      DME agency  NA     HH arranged  NA      HH [facility] [place]   Status of service:  Completed, signed off Medicare Important Message given?   (If response is "NO", the following Medicare IM given date fields will be blank) Date Medicare IM given:   Date Additional Medicare IM given:    Discharge Disposition:  HOME W HOSPICE CARE  Per UR Regulation:  Reviewed for med. necessity/level of care/duration of stay  If discussed at Long Length of Stay Meetings, dates discussed:    Comments:  04/25/12 Received call from patient's nurse stating patient is being discharged home today with hospice. Reviewed patient's chart and noted that [facility] is following for discharge home with hospice. Confirmed with [facility] triage nurse that patient is being discharged today with intent of being admitted to [facility] services. Faxed necessary paperwork to [facility] at fax number [phone] (confirmed number with [facility] triage nurse) with fax confirmation received. Spoke with patient and wife at bedside. They confirm that no DME is needed. He already has equipment and home o2 as well. Also gave Mrs Loyal the hospice number again [phone] to call once she got home since she does not have a cell number. Made patient's wife aware that [facility] is aware of patient's discharge today. No further needs assessed. De Hollingshead  [phone]   04/22/12 1356 Tymeeka Davis,RN,BSN [phone] Cm received phone call from Dr.Taylor asking CM to offer pt & spouse home hospice choice prior to GOC. CM spent over 20 minutes in patient's room with family present explaining home hospice. CM informed pt & family once hospice choice made Rep from Hospice agency will further contact pt & family to answer questions. Pt's spouse not present during CM consult. Pt provided Cm with permission to contact spouse. No choice made at this time.    04/21/12 1255 Tymeeka Davis,RN,BSN [phone] Chart reviewed. No needs assessed at this time. Pt from home with spouse with no prior [facility] services.

## 2012-04-25 NOTE — Progress Notes (Signed)
04/25/12 1320 Reviewed discharge instructions with patient and his wife. Both verbalized understanding of discharge. Copy of discharge instructions and prescriptions given to patient. Case Manager spoke with patient prior to discharge and will be faxing information to [facility] of [place]. Patient wife states she will call hospice once they arrive at home.

## 2012-04-25 NOTE — Discharge Summary (Signed)
Physician Discharge Summary  [name] WJX:[id number] DOB: 03-Mar-1950 DOA: 04/20/2012  PCP: Aida Puffer, MD  Admit date: 04/20/2012 Discharge date: 04/25/2012  Time spent: 25 minutes  Recommendations for Outpatient Follow-up:  1. Patient is being discharged with home hospice services.  There be a plan to transfer patient to [facility] and time.  Discharge Diagnoses:  Principal Problem:   Pancreatic cancer metastasized to liver Active Problems:   Diabetes mellitus without complication   COPD (chronic obstructive pulmonary disease)   Abdominal pain   Discharge Condition: Mild improvement, being discharged home. Long-term prognosis given stage IV pancreatic cancer is limited.  Diet recommendation: Heart healthy  Filed Weights   04/23/12 0524 04/24/12 0635 04/25/12 0535  Weight: 179.67 kg (396 lb 1.6 oz) 180.078 kg (397 lb) 180.214 kg (397 lb 4.8 oz)    History of present illness:  On 4/14: [name] is a 62 y.o. male, with history of morbid obesity, diabetes mellitus type 2 now insulin-dependent, COPD on home oxygen, history of bladder cancer in the past, who has had chronic abdominal pain for the last 2-3 years which is been getting worse for the last 3 months, also describes 120 pounds of unintentional weight loss in the last 3 months, patient also says that about 5 days ago he suffered a trip and fall at home and hurt his right upper quadrant and since then his epigastric abdominal pain and right upper quadrant abdominal pain has been worse, he describes pain as sharp with at times radiating to the back, worse with food better with bowel rest and pain medications, no associated symptoms.  In the ER workup suggestive of pancreatic mass on CT scan, transaminitis with elevated total bilirubin, hospitalists called to admit the patient for possible pancreatic cancer with metastases to the liver.   Hospital Course:  Principal Problem:   Pancreatic cancer metastasized to liver:  Oncology was consulted. Patient underwent ultrasound-guided biopsy of liver which confirmed diagnosis of metastatic pancreatic cancer. Oncology met with patient and discussed his options in that he is stage IV and quite advanced. Explained to patient that there were a little treatment modalities and that overall, regardless of aggressive treatment, patient had about 4-6 months of survival. Palliative care consulted and patient underwent goals of care therapy on 4/17.  Recommendations were made as follow: 1. Anxiety/Agitation: Continue the patient's Xanax for anxiety 2. Pain: At this time I would like to titrate oral medications for his chronic pain which has been difficult to control. Patient states that he has used oxycodone in the past for a dental procedure which he felt was a little bit too strong for the procedure but was willing to attempt to change from his hydrocodone which is not meeting his needs. Initially tried oxycodone 5 mg every 6 hours, but patient could not tolerate this secondary palpitations. Change over to morphine sulfate 7.5 mg every 8 hours plus every 4 hours when necessary 3. Bowel Regimen:Currently taking MiraLAX with good results would continue 4. Sleep apnea would continue to encourage the patient to use CPAP for comfort.  5. Jaundice secondary to metastatic disease , avoid Tylenol products  The patient will also go home with hospice. In time, patient will be transitioned over to [facility].   Active Problems:   Diabetes mellitus without complication: Patient discharged with home insulin.    COPD (chronic obstructive pulmonary disease): Stable. Nebulizer treatments when necessary.    Abdominal pain: To be in part from injury, but also from metastatic cancer. Patient  on pain medications . Morbid obesity: Weight loss as noted above.  Pannus cellulitis, left lower leg venous stasis ulcer, massive lymphedema in the right lower extremity  Continue with home antibiotic  initially. Nystatin cream will be continued. Local wound care. Following discharge, antibiotic discontinued  Procedures:  4/15: Ultrasound-guided biopsy of liver  Consultations: Interventional radiology for biopsy  [facility]-oncology  [facility]   Discharge Exam: Filed Vitals:   04/24/12 1445 04/24/12 1956 04/24/12 2145 04/25/12 0535  BP: 138/58  130/58 132/55  Pulse: 75  94 72  Temp: 98.9 F (37.2 C)  97.6 F (36.4 C) 98.8 F (37.1 C)  TempSrc: Oral  Oral Oral  Resp: 18  18 18   Height:      Weight:    180.214 kg (397 lb 4.8 oz)  SpO2: 93% 95% 95% 93%    General: Alert and oriented x3 , feeling mildly anxious otherwise no acute distress Cardiovascular: Regular rate and rhythm, S1-S2 Respiratory: Decreased breath sounds throughout secondary to body habitus Abdomen: Soft, morbidly obese, nontender, positive bowel sounds  Discharge Instructions  Discharge Orders   Future Appointments Provider Department Dept Phone   05/12/2012 1:30 PM [facility] [phone]   05/24/2012 2:00 PM Ladene Artist, MD [facility] [phone]   Future Orders Complete By Expires     Diet - low sodium heart healthy  As directed     Increase activity slowly  As directed     Increase activity slowly  As directed         Medication List    STOP taking these medications       HYDROcodone-acetaminophen 10-325 MG per tablet  Commonly known as:  NORCO     penicillin v potassium 500 MG tablet  Commonly known as:  VEETID      TAKE these medications       albuterol (5 MG/ML) 0.5% nebulizer solution  Commonly known as:  PROVENTIL  Take 0.5 mLs (2.5 mg total) by nebulization every 2 (two) hours as needed for wheezing.     allopurinol 300 MG tablet  Commonly known as:  ZYLOPRIM  Take 300 mg by mouth daily.     ALPRAZolam 1 MG tablet  Commonly known as:  XANAX  Take 3 mg by  mouth at bedtime as needed for sleep.     alum & mag hydroxide-simeth 200-200-20 MG/5ML suspension  Commonly known as:  MAALOX/MYLANTA  Take 30 mLs by mouth every 6 (six) hours as needed.     furosemide 80 MG tablet  Commonly known as:  LASIX  Take 80 mg by mouth daily.     insulin aspart 100 UNIT/ML injection  Commonly known as:  novoLOG  Inject 20 Units into the skin 3 (three) times daily before meals.     insulin detemir 100 UNIT/ML injection  Commonly known as:  LEVEMIR  Inject 0.2 mLs (20 Units total) into the skin every evening.     ketoconazole 2 % cream  Commonly known as:  NIZORAL  Apply 1 application topically daily. Applies to right leg     lipase/protease/amylase 29562 UNITS Cpep  Commonly known as:  CREON-10/PANCREASE  Take 1 capsule by mouth 3 (three) times daily before meals.     morphine 15 MG tablet  Commonly known as:  MSIR  Take 0.5 tablets (7.5 mg total) by mouth every 8 (eight) hours.     morphine 15 MG tablet  Commonly known as:  MSIR  Take 0.5 tablets (7.5 mg total) by mouth every 4 (four) hours as needed.     ondansetron 4 MG tablet  Commonly known as:  ZOFRAN  Take 1 tablet (4 mg total) by mouth every 6 (six) hours as needed for nausea.     polyethylene glycol packet  Commonly known as:  MIRALAX / GLYCOLAX  Take 17 g by mouth daily as needed.     polyethylene glycol packet  Commonly known as:  MIRALAX / GLYCOLAX  Take 17 g by mouth daily.     potassium chloride 10 MEQ tablet  Commonly known as:  K-DUR,KLOR-CON  Take 10 mEq by mouth daily.     promethazine 12.5 MG tablet  Commonly known as:  PHENERGAN  Take 1 tablet (12.5 mg total) by mouth every 6 (six) hours as needed for nausea.     zinc oxide 11.3 % Crea cream  Commonly known as:  BALMEX  Apply 1 application topically 2 (two) times daily. Applies to feet and legs           Follow-up Information   Follow up with LITTLE,JAMES, MD In 1 month.   Contact information:   [address] [place] [address] [phone]       Follow up with [facility]). ([facility] to follow aftr d/c-pls notify whn pt ready to leave unit : call [phone])    Contact information:   [facility] [address] [place], [place] [phone]      Follow up with Hollice Espy, MD. (Call for any questions or problems )    Contact information:   [facility] [address] [place] [address] [phone]        The results of significant diagnostics from this hospitalization (including imaging, microbiology, ancillary and laboratory) are listed below for reference.    Significant Diagnostic Studies: Dg Ribs Unilateral W/chest Right  04/20/2012    IMPRESSION:  1.  No active lung disease.  Mild cardiomegaly. 2.  Negative right rib detail.   Original Report Authenticated By: Dwyane Dee, M.D.    US Abdomen Complete  04/20/2012    IMPRESSION:  1.  Multiple gallstones fill the gallbladder.  However there is no present evidence of acute cholecystitis by ultrasound. 2.  Hypoechoic rounded structure in the mid body - neck of the pancreas as described above.  Recommend CT of the abdomen using pancreatic protocol to evaluate further. 3.  Inhomogeneous liver may indicate mild fatty infiltration.   Original Report Authenticated By: Dwyane Dee, M.D.    Ct Abdomen Pelvis W Contrast  04/20/2012    IMPRESSION: 1.  3.6 x 4.7 cm mass in the mid body of the pancreas consistent with pancreatic carcinoma.  2.  Multiple diffuse liver metastases.  3.  Peripancreatic as well as retroperitoneal and mesenteric adenopathy.   Original Report Authenticated By: Dwyane Dee, M.D.    US Biopsy  04/21/2012   IMPRESSION: 1.  Technically successful ultrasound-guided core biopsy of   liver lesion   Original Report Authenticated By: D. Andria Rhein, MD       Labs: Basic Metabolic Panel:  Recent Labs Lab 04/20/12 1111 04/21/12 0410 04/22/12 0349  NA 134* 136 136  K 3.6  3.8 3.6  CL 96 99 97  CO2 26 27 29   GLUCOSE 90 121* 130*  BUN 10 10 12   CREATININE 0.67 0.70 0.74  CALCIUM 9.2 8.2* 8.5  Liver Function Tests:  Recent Labs Lab 04/20/12 1111 04/22/12 0349  AST 143* 119*  ALT 132* 108*  ALKPHOS 302* 279*  BILITOT 5.2* 6.5*  PROT 6.4 6.3  ALBUMIN 2.8* 2.7*    Recent Labs Lab 04/20/12 1111  LIPASE 20   CBC:  Recent Labs Lab 04/20/12 1111 04/21/12 0410 04/22/12 0349  WBC 11.3* 10.6* 11.7*  NEUTROABS 7.9*  --   --   HGB 12.7* 12.0* 12.2*  HCT 38.2* 36.4* 37.5*  MCV 84.9 85.4 85.8  PLT 201 198 227   CBG:  Recent Labs Lab 04/24/12 0746 04/24/12 1207 04/24/12 1712 04/24/12 2142 04/25/12 0725  GLUCAP 93 113* 88 146* 121*       Signed:  KRISHNAN,SENDIL K  [facility] 04/25/2012, 3:27 PM

## 2012-04-27 LAB — GLUCOSE, CAPILLARY

## 2012-04-29 ENCOUNTER — Other Ambulatory Visit: Payer: Self-pay | Admitting: *Deleted

## 2012-04-29 MED ORDER — MORPHINE SULFATE 15 MG PO TABS: ORAL_TABLET | ORAL | Status: AC

## 2012-04-29 MED ORDER — MORPHINE SULFATE 15 MG PO TABS: 7.5000 mg | ORAL_TABLET | ORAL | Status: DC | PRN

## 2012-04-29 MED ORDER — ALBUTEROL SULFATE (5 MG/ML) 0.5% IN NEBU: 2.5000 mg | INHALATION_SOLUTION | Freq: Four times a day (QID) | RESPIRATORY_TRACT | Status: AC | PRN

## 2012-04-29 NOTE — Telephone Encounter (Signed)
Dr Darnelle Catalan ask for Hospice MD to visit patient home for pain management follow up. Will notify Rosey Bath of changes.

## 2012-04-29 NOTE — Telephone Encounter (Signed)
Call from hospice nurse, patient is having trouble managing pain at home with current regimen. Patient has been doing MSIR 7.5mg  q8hrs, round the clock, but is confused on the q4hour prn order. For clarification, hospice suggests MSIR 7.5mg  q4hrs prn. If patient needs more than 6 times daily, then long acting should be considered.  Also, patient was to be dispensed Albuterol Nebs, called and spoke to Casimiro Needle at [employer] [facility], he states that Rx is on file and will get this filled for patient. Called and spoke to wife for clarification of pain management, she understands that patient is to continue with current med supply of 30mg  tabs, taking 1/4 of tab until current supply used, and we will then fax new Rx for 15mg  tabs, to take 1/2 tab every 4hours prn. If patient needs more pain management, they are to call hospice for futher recommendations, and they can call us if needed. New Rx's faxed to [facility].

## 2012-05-09 ENCOUNTER — Encounter (HOSPITAL_COMMUNITY): Payer: Self-pay | Admitting: *Deleted

## 2012-05-09 ENCOUNTER — Inpatient Hospital Stay (HOSPITAL_COMMUNITY)
Admission: EM | Admit: 2012-05-09 | Discharge: 2012-06-07 | DRG: 435 | Disposition: E | Attending: Family Medicine | Admitting: Family Medicine

## 2012-05-09 DIAGNOSIS — K7689 Other specified diseases of liver: Secondary | ICD-10-CM | POA: Diagnosis present

## 2012-05-09 DIAGNOSIS — F17200 Nicotine dependence, unspecified, uncomplicated: Secondary | ICD-10-CM | POA: Diagnosis present

## 2012-05-09 DIAGNOSIS — C787 Secondary malignant neoplasm of liver and intrahepatic bile duct: Secondary | ICD-10-CM | POA: Diagnosis present

## 2012-05-09 DIAGNOSIS — R627 Adult failure to thrive: Secondary | ICD-10-CM | POA: Diagnosis present

## 2012-05-09 DIAGNOSIS — C259 Malignant neoplasm of pancreas, unspecified: Principal | ICD-10-CM | POA: Diagnosis present

## 2012-05-09 DIAGNOSIS — R17 Unspecified jaundice: Secondary | ICD-10-CM | POA: Diagnosis present

## 2012-05-09 DIAGNOSIS — F411 Generalized anxiety disorder: Secondary | ICD-10-CM | POA: Diagnosis present

## 2012-05-09 DIAGNOSIS — R404 Transient alteration of awareness: Secondary | ICD-10-CM | POA: Diagnosis present

## 2012-05-09 DIAGNOSIS — E873 Alkalosis: Secondary | ICD-10-CM | POA: Diagnosis present

## 2012-05-09 DIAGNOSIS — E872 Acidosis, unspecified: Secondary | ICD-10-CM | POA: Diagnosis present

## 2012-05-09 DIAGNOSIS — Z794 Long term (current) use of insulin: Secondary | ICD-10-CM

## 2012-05-09 DIAGNOSIS — E876 Hypokalemia: Secondary | ICD-10-CM | POA: Diagnosis present

## 2012-05-09 DIAGNOSIS — R4182 Altered mental status, unspecified: Secondary | ICD-10-CM

## 2012-05-09 DIAGNOSIS — G4733 Obstructive sleep apnea (adult) (pediatric): Secondary | ICD-10-CM | POA: Diagnosis present

## 2012-05-09 DIAGNOSIS — Z6841 Body Mass Index (BMI) 40.0 and over, adult: Secondary | ICD-10-CM

## 2012-05-09 DIAGNOSIS — J449 Chronic obstructive pulmonary disease, unspecified: Secondary | ICD-10-CM | POA: Diagnosis present

## 2012-05-09 DIAGNOSIS — J9600 Acute respiratory failure, unspecified whether with hypoxia or hypercapnia: Secondary | ICD-10-CM | POA: Diagnosis not present

## 2012-05-09 DIAGNOSIS — J4489 Other specified chronic obstructive pulmonary disease: Secondary | ICD-10-CM | POA: Diagnosis present

## 2012-05-09 DIAGNOSIS — Z66 Do not resuscitate: Secondary | ICD-10-CM | POA: Diagnosis present

## 2012-05-09 DIAGNOSIS — R0603 Acute respiratory distress: Secondary | ICD-10-CM

## 2012-05-09 DIAGNOSIS — E119 Type 2 diabetes mellitus without complications: Secondary | ICD-10-CM | POA: Diagnosis present

## 2012-05-09 DIAGNOSIS — R109 Unspecified abdominal pain: Secondary | ICD-10-CM

## 2012-05-09 DIAGNOSIS — Z515 Encounter for palliative care: Secondary | ICD-10-CM

## 2012-05-09 HISTORY — DX: Encounter for palliative care: Z51.5

## 2012-05-09 HISTORY — DX: Morbid (severe) obesity due to excess calories: E66.01

## 2012-05-09 LAB — COMPREHENSIVE METABOLIC PANEL
ALT: 100 U/L — ABNORMAL HIGH (ref 0–53)
Alkaline Phosphatase: 382 U/L — ABNORMAL HIGH (ref 39–117)
BUN: 38 mg/dL — ABNORMAL HIGH (ref 6–23)
CO2: 22 mEq/L (ref 19–32)
Chloride: 88 mEq/L — ABNORMAL LOW (ref 96–112)
GFR calc Af Amer: 53 mL/min — ABNORMAL LOW (ref >90)
GFR calc non Af Amer: 46 mL/min — ABNORMAL LOW (ref >90)
Glucose, Bld: 180 mg/dL — ABNORMAL HIGH (ref 70–99)
Potassium: 2.5 mEq/L — CL (ref 3.5–5.1)
Total Bilirubin: 29.7 mg/dL (ref 0.3–1.2)
Total Protein: 5.2 g/dL — ABNORMAL LOW (ref 6.0–8.3)

## 2012-05-09 LAB — CBC
Platelets: 124 10*3/uL — ABNORMAL LOW (ref 150–400)
RDW: 19.9 % — ABNORMAL HIGH (ref 11.5–15.5)
WBC: 15.5 10*3/uL — ABNORMAL HIGH (ref 4.0–10.5)

## 2012-05-09 LAB — POCT I-STAT 3, ART BLOOD GAS (G3+)
Acid-base deficit: 3 mmol/L — ABNORMAL HIGH (ref 0.0–2.0)
Patient temperature: 98.6
pH, Arterial: 7.36 (ref 7.350–7.450)

## 2012-05-09 LAB — CBC WITH DIFFERENTIAL/PLATELET
Eosinophils Absolute: 0.1 10*3/uL (ref 0.0–0.7)
Hemoglobin: 12.5 g/dL — ABNORMAL LOW (ref 13.0–17.0)
Lymphocytes Relative: 4 % — ABNORMAL LOW (ref 12–46)
Lymphs Abs: 0.6 10*3/uL — ABNORMAL LOW (ref 0.7–4.0)
MCH: 29.3 pg (ref 26.0–34.0)
Monocytes Relative: 12 % (ref 3–12)
Neutrophils Relative %: 83 % — ABNORMAL HIGH (ref 43–77)
RBC: 4.27 MIL/uL (ref 4.22–5.81)
WBC: 14.7 10*3/uL — ABNORMAL HIGH (ref 4.0–10.5)

## 2012-05-09 LAB — BASIC METABOLIC PANEL
Calcium: 8.4 mg/dL (ref 8.4–10.5)
Creatinine, Ser: 1.52 mg/dL — ABNORMAL HIGH (ref 0.50–1.35)
GFR calc Af Amer: 56 mL/min — ABNORMAL LOW (ref >90)

## 2012-05-09 LAB — CG4 I-STAT (LACTIC ACID): Lactic Acid, Venous: 6.13 mmol/L — ABNORMAL HIGH (ref 0.5–2.2)

## 2012-05-09 MED ORDER — SODIUM CHLORIDE 0.9 % IV SOLN
1.5000 mg/h | INTRAVENOUS | Status: DC
  Administered 2012-05-09: 1 mg/h via INTRAVENOUS
  Administered 2012-05-10 (×2): 1.5 mg/h via INTRAVENOUS
  Administered 2012-05-10: 1 mg/h via INTRAVENOUS
  Filled 2012-05-09: qty 10

## 2012-05-09 MED ORDER — FLEET ENEMA 7-19 GM/118ML RE ENEM
1.0000 | ENEMA | Freq: Every day | RECTAL | Status: DC | PRN
  Filled 2012-05-09: qty 1

## 2012-05-09 MED ORDER — ONDANSETRON HCL 4 MG/2ML IJ SOLN
4.0000 mg | Freq: Four times a day (QID) | INTRAMUSCULAR | Status: DC | PRN
  Administered 2012-05-10: 4 mg via INTRAVENOUS
  Filled 2012-05-09: qty 2

## 2012-05-09 MED ORDER — ALBUTEROL SULFATE (5 MG/ML) 0.5% IN NEBU
2.5000 mg | INHALATION_SOLUTION | Freq: Four times a day (QID) | RESPIRATORY_TRACT | Status: DC
  Administered 2012-05-09 – 2012-05-10 (×6): 2.5 mg via RESPIRATORY_TRACT
  Filled 2012-05-09 (×11): qty 0.5

## 2012-05-09 MED ORDER — ALBUTEROL SULFATE (5 MG/ML) 0.5% IN NEBU
2.5000 mg | INHALATION_SOLUTION | Freq: Once | RESPIRATORY_TRACT | Status: AC
  Administered 2012-05-09: 2.5 mg via RESPIRATORY_TRACT

## 2012-05-09 MED ORDER — ALBUTEROL SULFATE (5 MG/ML) 0.5% IN NEBU
INHALATION_SOLUTION | RESPIRATORY_TRACT | Status: AC
  Filled 2012-05-09: qty 0.5

## 2012-05-09 MED ORDER — ONDANSETRON HCL 4 MG/2ML IJ SOLN: 4.0000 mg | Freq: Three times a day (TID) | INTRAMUSCULAR | Status: AC | PRN

## 2012-05-09 MED ORDER — MORPHINE SULFATE 4 MG/ML IJ SOLN
8.0000 mg | INTRAMUSCULAR | Status: DC | PRN
  Administered 2012-05-09: 8 mg via INTRAVENOUS
  Filled 2012-05-09: qty 2

## 2012-05-09 MED ORDER — HYDROMORPHONE HCL PF 1 MG/ML IJ SOLN
1.0000 mg | INTRAMUSCULAR | Status: DC | PRN
  Administered 2012-05-09: 1 mg via INTRAVENOUS
  Filled 2012-05-09: qty 1

## 2012-05-09 MED ORDER — ALUM & MAG HYDROXIDE-SIMETH 200-200-20 MG/5ML PO SUSP
30.0000 mL | Freq: Four times a day (QID) | ORAL | Status: DC | PRN
  Filled 2012-05-09: qty 30

## 2012-05-09 MED ORDER — SODIUM CHLORIDE 0.9 % IV SOLN
INTRAVENOUS | Status: DC
  Administered 2012-05-09: 20 mL via INTRAVENOUS
  Administered 2012-05-11: 250 mL via INTRAVENOUS

## 2012-05-09 MED ORDER — MORPHINE SULFATE 2 MG/ML IJ SOLN
2.0000 mg | Freq: Once | INTRAMUSCULAR | Status: AC
  Administered 2012-05-09: 2 mg via INTRAVENOUS

## 2012-05-09 MED ORDER — HYDROMORPHONE HCL PF 1 MG/ML IJ SOLN: 1.0000 mg | INTRAMUSCULAR | Status: DC | PRN

## 2012-05-09 MED ORDER — ONDANSETRON HCL 4 MG PO TABS: 4.0000 mg | ORAL_TABLET | Freq: Four times a day (QID) | ORAL | Status: DC | PRN

## 2012-05-09 MED ORDER — ALBUTEROL SULFATE (5 MG/ML) 0.5% IN NEBU
2.5000 mg | INHALATION_SOLUTION | RESPIRATORY_TRACT | Status: DC | PRN
  Administered 2012-05-09 – 2012-05-11 (×3): 2.5 mg via RESPIRATORY_TRACT
  Filled 2012-05-09 (×2): qty 0.5

## 2012-05-09 MED ORDER — MORPHINE BOLUS VIA INFUSION
1.0000 mg | INTRAVENOUS | Status: DC | PRN
  Administered 2012-05-10 – 2012-05-11 (×9): 1 mg via INTRAVENOUS
  Filled 2012-05-09: qty 1

## 2012-05-09 MED ORDER — SODIUM CHLORIDE 0.9 % IV SOLN
Freq: Once | INTRAVENOUS | Status: AC
  Administered 2012-05-09: 03:00:00 via INTRAVENOUS

## 2012-05-09 MED ORDER — FUROSEMIDE 10 MG/ML IJ SOLN
40.0000 mg | Freq: Every day | INTRAMUSCULAR | Status: DC
  Administered 2012-05-10: 40 mg via INTRAVENOUS
  Filled 2012-05-09 (×2): qty 4

## 2012-05-09 MED ORDER — SODIUM CHLORIDE 0.9 % IV SOLN
INTRAVENOUS | Status: AC
  Administered 2012-05-09: 05:00:00 via INTRAVENOUS

## 2012-05-09 MED ORDER — PANCRELIPASE (LIP-PROT-AMYL) 12000-38000 UNITS PO CPEP
1.0000 | ORAL_CAPSULE | Freq: Three times a day (TID) | ORAL | Status: DC
  Filled 2012-05-09 (×6): qty 1

## 2012-05-09 MED ORDER — LORAZEPAM 2 MG/ML IJ SOLN
0.5000 mg | INTRAMUSCULAR | Status: DC | PRN
  Administered 2012-05-09 – 2012-05-11 (×7): 0.5 mg via INTRAVENOUS
  Filled 2012-05-09 (×8): qty 1

## 2012-05-09 MED ORDER — ACETAMINOPHEN 325 MG PO TABS: 650.0000 mg | ORAL_TABLET | Freq: Four times a day (QID) | ORAL | Status: DC | PRN

## 2012-05-09 MED ORDER — DOCUSATE SODIUM 100 MG PO CAPS: 100.0000 mg | ORAL_CAPSULE | Freq: Two times a day (BID) | ORAL | Status: DC | PRN

## 2012-05-09 MED ORDER — MORPHINE SULFATE 2 MG/ML IJ SOLN
2.0000 mg | INTRAMUSCULAR | Status: DC | PRN
  Administered 2012-05-09 (×4): 2 mg via INTRAVENOUS
  Filled 2012-05-09 (×6): qty 1

## 2012-05-09 MED ORDER — HYDROMORPHONE HCL PF 1 MG/ML IJ SOLN
1.0000 mg | Freq: Once | INTRAMUSCULAR | Status: AC
  Administered 2012-05-09: 1 mg via INTRAVENOUS
  Filled 2012-05-09: qty 1

## 2012-05-09 MED ORDER — ACETAMINOPHEN 650 MG RE SUPP: 650.0000 mg | Freq: Four times a day (QID) | RECTAL | Status: DC | PRN

## 2012-05-09 NOTE — Progress Notes (Signed)
Chaplain Note:  Chaplain visited with pt and pt's family.  Pt was resting in bed, awake, alert, and oriented.  Pt's family was gathered at bedside in support of pt.  Though it was difficult for pt to carry on and extended conversation he spoke of being weary and was ready to let go.  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  Pt expressed very deep faith and has no fear of dying.  Pt's family members share his faith and, though they are experiencing anticipatory grief, they remain a very positive support for the pt.  Chaplain provided spiritual comfort and support for pt and pt's family.  Chaplain provided a non-anxious listening presence and an environment in which the pt and members of the family could share their feelings.  Pt and family expressed appreciation for chaplain support.  Chaplain will follow up as needed.  06/03/2012 2200  Clinical Encounter Type  Visited With Patient and family together  Visit Type Spiritual support;Patient actively dying  Referral From Palliative care team  Consult/Referral To Chaplain  Spiritual Encounters  Spiritual Needs Emotional;Grief support  Stress Factors  Patient Stress Factors Exhausted;Health changes;Major life changes  Family Stress Factors Exhausted;Loss;Major life changes  Verdie Shire, [place] [phone]

## 2012-05-09 NOTE — ED Notes (Signed)
Per EMS:  Pt called EMS due to chest pain.  Chest pain began when pt did his home breathing exercises.  Pain is pressure type pain, non-radiating.  Pt is short of breath, lung sounds clear and equal bilaterally.  GCS 15.  Pt not complaining of any chest pains at this time.

## 2012-05-09 NOTE — ED Notes (Signed)
Pt reports he has had nothing for pain, informed pt that he did receive 1 mg of dilaudid at 3 am per taylor ivey rn

## 2012-05-09 NOTE — Progress Notes (Addendum)
Patient GN:FAOZH Losito      DOB: 07-22-1950      YQM:[id number]  Called at 658 with report that [name] breathing had continued to decline. Despite nebs, Morphine at 240 pm and again at 1846.  Jezreel was awake but intermittently confused breathing labored .  He was able to tell me that he would ok a foley catheter since he can't get up to urinate and has not voided all afternoon..  Plan to trial some lasix for comfort as he did not have his dose orally today, and initiate a low dose morphine drip. [name] ok with this plan.  I spoke with his step mom and wife.  They will be in as soon as possible.  His sister is on the way from [place] and should arrive  Around 9 pm.    Appreciate [name] assisting in his care. Reviewed case with her.    Total time: 730 pm- 750 pm.  Asked the Chaplain to come by when family arrives.

## 2012-05-09 NOTE — H&P (Signed)
[facility] History and Physical  [name] WUJ:[id number] DOB: [birth year] DOA: 05/26/2012  Referring physician: EDP PCP: Aida Puffer, MD  Specialists:   Chief Complaint:   Chest Pain  HPI: [name] is a 62 y.o. male with Metastatic Pancreatic Cancer who began to have chest pain at home and called EMS.   He has had a continued decline since he was diagnosed with metastatic Pancreatic Cancer  2 weeks ago.   He reports having progressive weakness and fatigue, and increased ABD pain.   He reports SOB with exertion.   He has been receiving home hospice and reports that he is a DNR and he and his family wish for him to be placed at the [facility].   He has been referred for admission.      Review of Systems: The patient denies fever, vision loss, decreased hearing, hoarseness, syncope, balance deficits, hemoptysis, nausea, vomiting, diarrhea, constipation, hematemesis, melena, hematochezia, severe indigestion/heartburn, hematuria, incontinence, dysuria, suspicious skin lesions, transient blindness, depression, abnormal bleeding, enlarged lymph nodes, angioedema, and breast masses.    Past Medical History  Diagnosis Date  . Diabetes mellitus without complication   . COPD (chronic obstructive pulmonary disease)   . Cancer     Pancreatic with Mets, Bladder  . Palliative care encounter   . Morbid obesity      History reviewed. No pertinent past surgical history.     Medications:  HOME MEDS: Prior to Admission medications   Medication Sig Start Date End Date Taking? Authorizing Provider  albuterol (PROVENTIL) (5 MG/ML) 0.5% nebulizer solution Take 0.5 mLs (2.5 mg total) by nebulization every 6 (six) hours as needed for wheezing or shortness of breath. 04/29/12  Yes Lowella Dell, MD  allopurinol (ZYLOPRIM) 300 MG tablet Take 300 mg by mouth daily.   Yes Historical Provider, MD  ALPRAZolam Prudy Feeler) 1 MG tablet Take 3 mg by mouth at bedtime.    Yes Historical Provider, MD   alum & mag hydroxide-simeth (MAALOX/MYLANTA) 200-200-20 MG/5ML suspension Take 30 mLs by mouth every 6 (six) hours as needed. 04/23/12  Yes Hollice Espy, MD  furosemide (LASIX) 80 MG tablet Take 80 mg by mouth daily.   Yes Historical Provider, MD  insulin aspart (NOVOLOG) 100 UNIT/ML injection Inject 20 Units into the skin 3 (three) times daily before meals.   Yes Historical Provider, MD  insulin detemir (LEVEMIR) 100 UNIT/ML injection Inject 0.2 mLs (20 Units total) into the skin every evening. 04/23/12  Yes Hollice Espy, MD  ketoconazole (NIZORAL) 2 % cream Apply 1 application topically daily. Applies to right leg   Yes Historical Provider, MD  lipase/protease/amylase (CREON-10/PANCREASE) 12000 UNITS CPEP Take 1 capsule by mouth 3 (three) times daily before meals. 04/25/12  Yes Hollice Espy, MD  morphine (MSIR) 15 MG tablet Take 15mg (1tablet) every 8hours round the clock, may take additional tablet every 4hours as needed for additional pain. 04/29/12  Yes Lowella Dell, MD  ondansetron (ZOFRAN) 4 MG tablet Take 1 tablet (4 mg total) by mouth every 6 (six) hours as needed for nausea. 04/23/12  Yes Hollice Espy, MD  polyethylene glycol (MIRALAX / GLYCOLAX) packet Take 17 g by mouth daily as needed. 04/23/12  Yes Hollice Espy, MD  potassium chloride (K-DUR,KLOR-CON) 10 MEQ tablet Take 10 mEq by mouth daily.   Yes Historical Provider, MD  promethazine (PHENERGAN) 12.5 MG tablet Take 1 tablet (12.5 mg total) by mouth every 6 (six) hours as needed for nausea. 04/23/12  Yes  Hollice Espy, MD  senna-docusate (SENOKOT-S) 8.6-50 MG per tablet Take 1 tablet by mouth daily as needed for constipation.   Yes Historical Provider, MD  zinc oxide (BALMEX) 11.3 % CREA cream Apply 1 application topically 2 (two) times daily. Applies to feet and legs   Yes Historical Provider, MD     Allergies:  Allergies  Allergen Reactions  . Oxycodone Other (See Comments)    Heart palpitations  .  Epinephrine   . Keflex (Cephalexin)      Social History:   reports that he has been smoking Cigarettes.  He has been smoking about 0.00 packs per day. He does not have any smokeless tobacco history on file. He reports that he does not drink alcohol or use illicit drugs.    Family History: Family History  Problem Relation Age of Onset  . Cancer Father     Bladder      Physical Exam:  GEN:  Jaundiced Morbidly Obese  62 year old Caucasian Male examined  and in  Discomfort but no acute distress; cooperative with exam Filed Vitals:   05/23/2012 0400 06/04/2012 0415 05/31/2012 0430 05/15/2012 0436  BP: 103/40 98/45 104/45 104/45  Pulse: 78 79 78 77  Temp:      TempSrc:      Resp: 26 27 24 24   Height:      Weight:      SpO2: 92% 92% 93% 93%   Blood pressure 104/45, pulse 77, temperature 98.4 F (36.9 C), temperature source Oral, resp. rate 24, height 5\' 9"  (1.753 m), weight 176.903 kg (390 lb), SpO2 93.00%. PSYCH: He is alert and oriented x4; does not appear anxious does not appear depressed; affect is normal HEENT: Normocephalic and Atraumatic, Mucous membranes pink; PERRLA; EOM intact; Fundi:  Benign;  +Scleral icterus, Nares: Patent, Oropharynx: Clear, Fair Dentition, Neck:  FROM, no cervical lymphadenopathy nor thyromegaly or carotid bruit; no JVD; Breasts:: Not examined CHEST WALL: No tenderness CHEST: Normal respiration, clear to auscultation bilaterally HEART: Regular rate and rhythm; no murmurs rubs or gallops BACK: No kyphosis or scoliosis; no CVA tenderness ABDOMEN: Positive Bowel Sounds,  Obese, soft non-tender; no masses. Rectal Exam: Not done EXTREMITIES: No cyanosis, clubbing or #+EDEMA BLEs Genitalia: not examined PULSES: 2+ and symmetric SKIN: Normal hydration no rash or ulceration CNS: Cranial nerves 2-12 grossly intact no focal neurologic deficit   Labs & Imaging Results for orders placed during the hospital encounter of 05/24/2012 (from the past 48 hour(s))  CBC  WITH DIFFERENTIAL     Status: Abnormal   Collection Time    05/28/2012  2:50 AM      Result Value Range   WBC 14.7 (*) 4.0 - 10.5 K/uL   RBC 4.27  4.22 - 5.81 MIL/uL   Hemoglobin 12.5 (*) 13.0 - 17.0 g/dL   HCT 81.1 (*) 91.4 - 78.2 %   MCV 83.6  78.0 - 100.0 fL   MCH 29.3  26.0 - 34.0 pg   MCHC 35.0  30.0 - 36.0 g/dL   RDW 95.6 (*) 21.3 - 08.6 %   Platelets 127 (*) 150 - 400 K/uL   Neutrophils Relative 83 (*) 43 - 77 %   Neutro Abs 12.2 (*) 1.7 - 7.7 K/uL   Lymphocytes Relative 4 (*) 12 - 46 %   Lymphs Abs 0.6 (*) 0.7 - 4.0 K/uL   Monocytes Relative 12  3 - 12 %   Monocytes Absolute 1.8 (*) 0.1 - 1.0 K/uL   Eosinophils  Relative 1  0 - 5 %   Eosinophils Absolute 0.1  0.0 - 0.7 K/uL   Basophils Relative 1  0 - 1 %   Basophils Absolute 0.2 (*) 0.0 - 0.1 K/uL  COMPREHENSIVE METABOLIC PANEL     Status: Abnormal   Collection Time    05/30/2012  2:50 AM      Result Value Range   Sodium 132 (*) 135 - 145 mEq/L   Potassium 2.5 (*) 3.5 - 5.1 mEq/L   Comment: CRITICAL RESULT CALLED TO, READ BACK BY AND VERIFIED WITH:     BROWN M,RN 05/18/2012 0341 WAYK   Chloride 88 (*) 96 - 112 mEq/L   CO2 22  19 - 32 mEq/L   Glucose, Bld 180 (*) 70 - 99 mg/dL   BUN 38 (*) 6 - 23 mg/dL   Creatinine, Ser 4.54 (*) 0.50 - 1.35 mg/dL   Comment: ICTERUS AT THIS LEVEL MAY AFFECT RESULT   Calcium 8.7  8.4 - 10.5 mg/dL   Total Protein 5.2 (*) 6.0 - 8.3 g/dL   Albumin 2.1 (*) 3.5 - 5.2 g/dL   AST 098 (*) 0 - 37 U/L   ALT 100 (*) 0 - 53 U/L   Alkaline Phosphatase 382 (*) 39 - 117 U/L   Total Bilirubin 29.7 (*) 0.3 - 1.2 mg/dL   Comment: CRITICAL RESULT CALLED TO, READ BACK BY AND VERIFIED WITH:     BROWN M,RN 06/03/2012 0341 WAYK   GFR calc non Af Amer 46 (*) >90 mL/min   GFR calc Af Amer 53 (*) >90 mL/min   Comment:            The eGFR has been calculated     using the CKD EPI equation.     This calculation has not been     validated in all clinical     situations.     eGFR's persistently     <90 mL/min  signify     possible Chronic Kidney Disease.  POCT I-STAT 3, BLOOD GAS (G3+)     Status: Abnormal   Collection Time    05/25/2012  2:54 AM      Result Value Range   pH, Arterial 7.360  7.350 - 7.450   pCO2 arterial 39.3  35.0 - 45.0 mmHg   pO2, Arterial 189.0 (*) 80.0 - 100.0 mmHg   Bicarbonate 22.2  20.0 - 24.0 mEq/L   TCO2 23  0 - 100 mmol/L   O2 Saturation 100.0     Acid-base deficit 3.0 (*) 0.0 - 2.0 mmol/L   Patient temperature 98.6 F     Collection site RADIAL, ALLEN'S TEST ACCEPTABLE     Drawn by RT     Sample type ARTERIAL    CG4 I-STAT (LACTIC ACID)     Status: Abnormal   Collection Time    05/24/2012  3:01 AM      Result Value Range   Lactic Acid, Venous 6.13 (*) 0.5 - 2.2 mmol/L     Radiological Exams on Admission: No results found.    Assessment/Plan Principal Problem:   Pancreatic cancer metastasized to liver Active Problems:   Diabetes mellitus without complication   COPD (chronic obstructive pulmonary disease)   Abdominal pain   Jaundice   Hypokalemia   Lactic acidosis    1.  Metastatic Pancreatic Cancer- Terminal, Admission for Palliative Care and arrange admission to [facility]  2.  Jaundice- due to #1.     3.  LActic Acidosis-  Due to #1  4.  Hypokalemia- Replete K+.    5.  COPD- Albuterol Nebs PRN.    6.  DM2-  SSI coverage PRN.              Code Status:  DNR Family Communication:  Wife at Bedside Disposition Plan:  TBA  Time spent: 96 Minutes  Ron Parker [facility] Pager [phone]  If 7PM-7AM, please contact night-coverage [email or web address] Password [email or web address] 05/12/2012, 4:59 AM

## 2012-05-09 NOTE — ED Provider Notes (Signed)
History     CSN: [id number]  Arrival date & time 05/14/2012  0231   First MD Initiated Contact with Patient 05/28/2012 0232      No chief complaint on file.  HPI  Patient presents with concerns of fatigue, chest pain, dyspnea. He states that just prior to arrival the patient was having a difficult time arising from the commode, chills chest pain with this effort.  Prior to transport the pain eased substantially, and now he complains primarily of fatigue, generalized poor sensation. The patient states that he has metastatic pancreatic cancer. He states he wants to be comfortable. He reiterates his DO NOT RESUSCITATE status. He states that he has been at home, he was progressive fatigue over the past weeks since discharge. He has been receiving home hospice consultation. He denies new confusion, disorientation, vomiting.   Past Medical History  Diagnosis Date  . Diabetes mellitus without complication   . COPD (chronic obstructive pulmonary disease)   . Cancer     bladder  . Palliative care encounter   . Morbid obesity     History reviewed. No pertinent past surgical history.  No family history on file.  History  Substance Use Topics  . Smoking status: Current Every Day Smoker    Types: Cigarettes  . Smokeless tobacco: Not on file  . Alcohol Use: No      Review of Systems  All other systems reviewed and are negative.    Allergies  Oxycodone; Epinephrine; and Keflex  Home Medications   Current Outpatient Rx  Name  Route  Sig  Dispense  Refill  . albuterol (PROVENTIL) (5 MG/ML) 0.5% nebulizer solution   Nebulization   Take 0.5 mLs (2.5 mg total) by nebulization every 6 (six) hours as needed for wheezing or shortness of breath.   20 mL   1     HOSPICE PATIENT   . allopurinol (ZYLOPRIM) 300 MG tablet   Oral   Take 300 mg by mouth daily.         Marland Kitchen ALPRAZolam (XANAX) 1 MG tablet   Oral   Take 3 mg by mouth at bedtime as needed for sleep.         Marland Kitchen alum  & mag hydroxide-simeth (MAALOX/MYLANTA) 200-200-20 MG/5ML suspension   Oral   Take 30 mLs by mouth every 6 (six) hours as needed.   355 mL   0   . furosemide (LASIX) 80 MG tablet   Oral   Take 80 mg by mouth daily.         . insulin aspart (NOVOLOG) 100 UNIT/ML injection   Subcutaneous   Inject 20 Units into the skin 3 (three) times daily before meals.         . insulin detemir (LEVEMIR) 100 UNIT/ML injection   Subcutaneous   Inject 0.2 mLs (20 Units total) into the skin every evening.   10 mL   1   . ketoconazole (NIZORAL) 2 % cream   Topical   Apply 1 application topically daily. Applies to right leg         . lipase/protease/amylase (CREON-10/PANCREASE) 12000 UNITS CPEP   Oral   Take 1 capsule by mouth 3 (three) times daily before meals.   270 capsule   0   . morphine (MSIR) 15 MG tablet      Take 15mg (1tablet) every 8hours round the clock, may take additional tablet every 4hours as needed for additional pain.   60 tablet   0  HOSPICE PATIENT--PUT ON FILE, PATIENT HAS CURRENT  ...   . ondansetron (ZOFRAN) 4 MG tablet   Oral   Take 1 tablet (4 mg total) by mouth every 6 (six) hours as needed for nausea.   30 tablet   1   . polyethylene glycol (MIRALAX / GLYCOLAX) packet   Oral   Take 17 g by mouth daily.         . polyethylene glycol (MIRALAX / GLYCOLAX) packet   Oral   Take 17 g by mouth daily as needed.   14 each      . potassium chloride (K-DUR,KLOR-CON) 10 MEQ tablet   Oral   Take 10 mEq by mouth daily.         . promethazine (PHENERGAN) 12.5 MG tablet   Oral   Take 1 tablet (12.5 mg total) by mouth every 6 (six) hours as needed for nausea.   30 tablet   0   . zinc oxide (BALMEX) 11.3 % CREA cream   Topical   Apply 1 application topically 2 (two) times daily. Applies to feet and legs           BP 86/40  Temp(Src) 98.4 F (36.9 C) (Oral)  Resp 30  Ht 5\' 9"  (1.753 m)  Wt 390 lb (176.903 kg)  BMI 57.57 kg/m2  SpO2  100%  Physical Exam  Nursing note reviewed. Constitutional:  Morbidly obese, grossly jaundiced patient awake and alert  HENT:  Head: Normocephalic and atraumatic.  Eyes: Right eye exhibits no discharge. Left eye exhibits no discharge. Scleral icterus is present.  Neck: No tracheal deviation present.  Cardiovascular: Regular rhythm.  Tachycardia present.   Pulmonary/Chest: No stridor. Tachypnea noted. He has decreased breath sounds.  Abdominal:  Large abdomen, nontender  Skin:  Gross jaundice  Psychiatric: He has a normal mood and affect. His speech is normal and behavior is normal.    ED Course  Procedures (including critical care time)  Labs Reviewed  POCT I-STAT 3, BLOOD GAS (G3+) - Abnormal; Notable for the following:    pO2, Arterial 189.0 (*)    Acid-base deficit 3.0 (*)    All other components within normal limits  CG4 I-STAT (LACTIC ACID) - Abnormal; Notable for the following:    Lactic Acid, Venous 6.13 (*)    All other components within normal limits  CBC WITH DIFFERENTIAL  COMPREHENSIVE METABOLIC PANEL   No results found.   No diagnosis found.  After the conclusion of patient's initial evaluation I discussed his case with our hospice colleagues.  Taken from the patient is indeed a patient of the errors. As it is in the middle of the night currently, hospice will follow the patient following admission to our hospital to expedite hospice placement.    MDM  This patient with metastatic pancreatic cancer presents with multiple concerns, and on my exam is awake, alert, interactive, reiterating his DO NOT RESUSCITATE status, his desire to be comfortable. Given the progression of the patient's jaundice, per report, labs were sent.  These are notable for demonstration lactic acidosis, hyperbilirubinemia, hypokalemia, and multiple other abnormalities. The patient received IV fluid rehydration, analgesics, antiemetics, per his wish area He was admitted for further  palliative care, with expected transfer to inpatient hospice care if the patient survives that long.        Gerhard Munch, MD 06/03/2012 [phone]

## 2012-05-09 NOTE — Consult Note (Signed)
Patient RU:EAVWU Hyle      DOB: Jul 11, 1950      JWJ:[id number]     Consult Note from the Palliative Medicine Team at [facility]    Consult Requested by: Dr. Mahala Menghini    PCP: Aida Puffer, MD Reason for Consultation: Sx     Phone [phone] management Assessment of patients Current state: 62 yr old white male with known diagnosis of metastatic pancreatic cancer.  Patient recently discharged to home with [facility] at his request despite knowing limitations of his caregiver.  Returns with uncontrolled pain, more jaundice, and likely actively dying.  Spouse is currently in the ED being cared for for various complaints.  Mother-in-law at bedside with sister Aurther Loft.  Patient known to our service.  He was up and at some breakfast this am.  Currently, Traxton is somnolent ,  Opened eyes briefly for me but could not sustain wakefullness or converse.   Goals of Care: 1.  Code Status: DNR/ DNI (change from last visit)   2. Scope of Treatment:  Patient's goals at discharge were to be as comfortable as possible at the end of his life.  He felt he had to try to go home for the sake of his wife.  He is likely actively dying.  4. Disposition: Likely will have a hospital death unless placement at [facility] able to be obtained. I have contacted [facility].  Their SW will likely be in today.   3. Symptom Management:     1. Anxiety/Agitation: patient does have anxiety issues and uses xanax at home will leave low dose prn ativan. 2. Pain: Will dc dilaudid, agree with morphine 2 mg q 4 hours prn can titrate as needed.  If he becomes more awake and alert can consider scheduled dosing .  If he has respiratory distress may need continuous infusion 3. Bowel Regimen: Monitor 4. Delirium: Monitor.  High risk for developing. 5. Fever: Tylenol po or pr 6. Nausea/Vomiting: prn meds in place agree 7. Terminal Secretions: Atropine prn 8. COPD with sleep apnea . Try to wean to [place] may by increasing CO2 narcosis.  4.  Psychosocial:  Patient is a Technical sales engineer.  He is married.  His wife Gaylyn Lambert has some mental health issues which will need to be  Kept in mind as Maclovio completes his journey.  5. Spiritual:  He is a christian will place chaplain consult for prn support        Patient Documents Completed or Given: Document Given Completed  Advanced Directives Pkt    MOST    DNR    Gone from My Sight    Hard Choices      Brief HPI: 62 yr old white male with pancreatic cancer metastatic to liver.   Readmitted with increased pain and shortness of breath.  Asked to assist with [facility] placement and symptom management at the end of life.   ROS: unable patient obtunded.    PMH:  Past Medical History  Diagnosis Date  . Diabetes mellitus without complication   . COPD (chronic obstructive pulmonary disease)   . Cancer     Pancreatic with Mets, Bladder  . Palliative care encounter   . Morbid obesity      ION:GEXBMWU reviewed. No pertinent past surgical history. I have reviewed the FH and SH and  If appropriate update it with new information. Allergies  Allergen Reactions  . Oxycodone Other (See Comments)    Heart palpitations  . Epinephrine   . Keflex (Cephalexin)  Scheduled Meds: . sodium chloride   Intravenous STAT  . albuterol  2.5 mg Nebulization Q6H  . lipase/protease/amylase  1 capsule Oral TID WC   Continuous Infusions: . sodium chloride Stopped (06/02/2012 0519)   PRN Meds:.acetaminophen, acetaminophen, albuterol, alum & mag hydroxide-simeth, HYDROmorphone (DILAUDID) injection, morphine injection, ondansetron (ZOFRAN) IV, ondansetron (ZOFRAN) IV, ondansetron    BP 91/39  Pulse 74  Temp(Src) 98 F (36.7 C) (Axillary)  Resp 24  Ht 5\' 9"  (1.753 m)  Wt 176.903 kg (390 lb)  BMI 57.57 kg/m2  SpO2 95%   PPS: 10-20 %   Intake/Output Summary (Last 24 hours) at 05/30/2012 0928 Last data filed at 05/24/2012 0600  Gross per 24 hour  Intake 2009.33 ml  Output      0  ml  Net 2009.33 ml   LBM: PTA  Physical Exam:  General: obtunded, will flutter eyes but not wakeful HEENT:  Perrl, eomi, icteric sclear and skin Chest:   Decreased with bilateral rhonchi CVS: distant, regular, rate and rhythm Abdomen:morbidly obese, no grimace on palpation Ext: edema bilateral with skin tear right shin Neuro:obtunded,    Labs: CBC    Component Value Date/Time   WBC 15.5* 05/31/2012 0607   RBC 4.12* 05/08/2012 0607   HGB 11.9* 05/08/2012 0607   HCT 34.5* 05/31/2012 0607   PLT 124* 05/15/2012 0607   MCV 83.7 05/07/2012 0607   MCH 28.9 05/08/2012 0607   MCHC 34.5 06/05/2012 0607   RDW 19.9* 05/15/2012 0607   LYMPHSABS 0.6* 05/15/2012 0250   MONOABS 1.8* 06/03/2012 0250   EOSABS 0.1 05/14/2012 0250   BASOSABS 0.2* 06/06/2012 0250      CMP     Component Value Date/Time   NA 131* 05/28/2012 0607   K 3.0* 06/01/2012 0607   CL 91* 05/16/2012 0607   CO2 25 05/16/2012 0607   GLUCOSE 185* 05/08/2012 0607   BUN 38* 05/08/2012 0607   CREATININE 1.52* 05/14/2012 0607   CALCIUM 8.4 05/29/2012 0607   PROT 5.2* 05/27/2012 0250   ALBUMIN 2.1* 05/18/2012 0250   AST 272* 05/14/2012 0250   ALT 100* 05/10/2012 0250   ALKPHOS 382* 05/27/2012 0250   BILITOT 29.7* 05/26/2012 0250   GFRNONAA 48* 06/01/2012 0607   GFRAA 56* 05/19/2012 0607      Time In Time Out Total Time Spent with Patient Total Overall Time  915 am 945 am 15 min 30 min   Discussed with [facility], and Dr. Mahala Menghini.  Greater than 50%  of this time was spent counseling and coordinating care related to the above assessment and plan.  Anniston Nellums L. Ladona Ridgel, MD MBA The Palliative Medicine Team at [facility] Phone: [phone] Pager: [phone]

## 2012-05-09 NOTE — Progress Notes (Signed)
Pt admitted to room 6704. Alert and oriented x 3-4. Family present at this time. Skin warm, dry and very jaundiced. On a NRB mask saturation at low 90's. Medicated for generalized pain as ordered. Kept comfortable. Pt is morbidly obese with generalized swelling. Open area to RLE. Unable to complete physical assessment secondary to pain. Will continue to monitor.

## 2012-05-09 NOTE — ED Notes (Signed)
Moved to bariatric bed.

## 2012-05-09 NOTE — Progress Notes (Signed)
16 Fr Coude catheter placed per MD order.  Blood tinged urine noted in catheter bag.  Patient tolerated well.

## 2012-05-09 NOTE — Progress Notes (Signed)
2:47 PM I agree with HPI/GPe and A/P per Dr. Julian Reil  Patient seen and examined at that he Family at bedside. The patient visibly but pretty somnolent in respiratory distress breathing about 30 times a minute. He is however somewhat oriented and is able to tell me numerous things related to this past medical history He denies any specific pain at present denies any nausea vomiting Family states he hasn't eaten much this morning at all just some green beans and a cup of tea    HEENT CHEST CARDIAC ABDOMEN NEURO SKIN/MUSCULAR  Patient Active Problem List   Diagnosis Date Noted  . Jaundice Jun 02, 2012  . Hypokalemia 02-Jun-2012  . Lactic acidosis 06-02-2012  . Abdominal pain 04/20/2012  . Pancreatic cancer metastasized to liver 04/20/2012  . Diabetes mellitus without complication   . COPD (chronic obstructive pulmonary disease)    A/p Very poor prognosis-comfort care-concur that he'll probably have a hospital death-given his degree of respiratory distress he probably will need continuous infusion morphine. He was taken off of nonrebreather and placed on a Ventimask however I do not think this makes a big difference overall. We will follow along.  Pleas Koch, MD [facility] [phone]

## 2012-05-10 ENCOUNTER — Encounter (HOSPITAL_COMMUNITY): Payer: Self-pay

## 2012-05-10 DIAGNOSIS — R0989 Other specified symptoms and signs involving the circulatory and respiratory systems: Secondary | ICD-10-CM

## 2012-05-10 DIAGNOSIS — J4489 Other specified chronic obstructive pulmonary disease: Secondary | ICD-10-CM

## 2012-05-10 DIAGNOSIS — J449 Chronic obstructive pulmonary disease, unspecified: Secondary | ICD-10-CM

## 2012-05-10 DIAGNOSIS — C259 Malignant neoplasm of pancreas, unspecified: Secondary | ICD-10-CM

## 2012-05-10 DIAGNOSIS — E872 Acidosis, unspecified: Secondary | ICD-10-CM

## 2012-05-10 NOTE — Progress Notes (Signed)
Patient admitted with respiratory distress and uncontrolled pain.  DNR on chart.  Found patient resting, FLACC score 0.  Family member at the bedside and also resting, RN did not disturb.  Reviewed chart and patient continues to receive Morphine gtt for pain management.  Patient's blood pressure and urine output have been declining and staff anticipate hospital death.  Continue end of life care support and encourage a call to Hospice at [phone] with any needs.  April Costella Hatcher, RN, BSN Hospice

## 2012-05-10 NOTE — Progress Notes (Signed)
PROGRESS NOTE  [name] EAV:[id number] DOB: 10/11/1950 DOA: 05/31/2012 PCP: Aida Puffer, MD  Brief narrative: 62 year old male with metastatic pancreatic cancer recently discharged from [facility] long hospital was discharged on home hospice and returned hospital with failure to thrive increasing pain and progressive weakness and fatigue     Subjective  Somnolent but arousable. Does not like face mask.   Objective    Interim History: None  Telemetry: None  Objective: Filed Vitals:   05/14/2012 1627 06/04/2012 2131 05/10/12 0557 05/10/12 1000  BP: 91/36  83/47 98/45  Pulse: 76  82 74  Temp: 97.6 F (36.4 C)  97.3 F (36.3 C) 98 F (36.7 C)  TempSrc: Oral  Oral Oral  Resp: 26  25 22   Height:      Weight:      SpO2: 85% 90% 96% 88%    Intake/Output Summary (Last 24 hours) at 05/10/12 1709 Last data filed at 05/10/12 0900  Gross per 24 hour  Intake      0 ml  Output   1050 ml  Net  -1050 ml    Exam:  General: Morbidly obese, orange colored skin Cardiovascular: S1-S2 no murmur rub or Respiratory: Clinically clear Abdomen: Obese slightly painful over her right upper quadrant and Skin yellow Neuro intact but sleepy at the  Data Reviewed: Basic Metabolic Panel:  Recent Labs Lab 05/29/2012 0250 05/29/2012 0607  NA 132* 131*  K 2.5* 3.0*  CL 88* 91*  CO2 22 25  GLUCOSE 180* 185*  BUN 38* 38*  CREATININE 1.60* 1.52*  CALCIUM 8.7 8.4   Liver Function Tests:  Recent Labs Lab 06/01/2012 0250  AST 272*  ALT 100*  ALKPHOS 382*  BILITOT 29.7*  PROT 5.2*  ALBUMIN 2.1*   No results found for this basename: LIPASE, AMYLASE,  in the last 168 hours No results found for this basename: AMMONIA,  in the last 168 hours CBC:  Recent Labs Lab 05/12/2012 0250 05/07/2012 0607  WBC 14.7* 15.5*  NEUTROABS 12.2*  --   HGB 12.5* 11.9*  HCT 35.7* 34.5*  MCV 83.6 83.7  PLT 127* 124*   Cardiac Enzymes: No results found for this basename: CKTOTAL, CKMB, CKMBINDEX,  TROPONINI,  in the last 168 hours BNP: No components found with this basename: POCBNP,  CBG: No results found for this basename: GLUCAP,  in the last 168 hours  No results found for this or any previous visit (from the past 240 hour(s)).   Studies:              All Imaging reviewed and is as per above notation   Scheduled Meds: . albuterol  2.5 mg Nebulization Q6H   Continuous Infusions: . sodium chloride Stopped (05/16/2012 0519)  . morphine 1.5 mg/hr (05/10/12 0753)     Assessment/Plan: 1. Patient with metastatic pancreatic cancer with exceedingly poor prognosis-patient is on currently low-dose morphine drip and Ativan. Family members multiple at bedside. Appreciate palliative medicine assistance and care I suspect his overall alkalosis very poor and this will be Hospital the night  Code Status: Comfort Family Communication: Discussed with wife at bedside Disposition Plan: Inpatient   Pleas Koch, MD  [facility] Pager [phone] 05/10/2012, 5:09 PM    LOS: 1 day

## 2012-05-10 NOTE — Progress Notes (Signed)
Patient [name]      DOB: [birth year]      AVW:[id number]   Palliative Medicine Team at [facility] Progress Note    Subjective: Settled down overnight on low dose Morphine drip.  Boluses required q other hour per nursing.  Just received some ativan.  Peeks intermittently but not engaging or speaking.  Sister in from [place].  Wife at bedside.  Urine output slow. Coude successfully placed.    Filed Vitals:   05/10/12 0557  BP: 83/47  Pulse: 82  Temp: 97.3 F (36.3 C)  Resp: 25   Variable  0 Points 1 Point 2 Points Total  Heart rate per minute  <90 beats 90-109 beats >110 beats 0  Respiratory  Rate per minute < 18 breaths 19-30 breaths  >30 breaths 1  Restlessness; nonpurposeful movements None  occas slight movement Frequent movement 1  Paradoxical breathing pattern: None  Present 0  Accessory muscle use: rise in clavicle during inspiration None Slight rise Pronounced rise 0  Grunting at end-expiration: guttural sound None  Present 0  Nasal flaring: involuntary movement of nares None  Present 0  Look of fear None  Eyes wide 0  Overall total out of 16    2    Respiratory Distress Observation Scale Journal of Palliative Medicine Vol. 13, Number 3, 2010 Campbell et al. Page. 285-290 Physical exam:  General : mild tachypnea, opens eyes slightly to tactile stimuli Icteric sclera and skin, mm dry Chest decreased but less secretions heard anteriorly CVS: regular , rate and rhythm Abd: obese , soft , no grimace on palpation Ext: edema, hyperpigmented, skin open on shin R Neuro:  Somnolent, but opens eyes  Assessment and plan: 62 yr old white male with metastatic pancreatic cancer returns to hospital with respiratory distress and uncontrolled pain.  Initiated a morphine drip that evening.  Decreasing blood pressure and urine output.  Updated family on prognosis, hours to days  1.  DNR  2.  Respiratory failure:  Increase basilar rate to 1.5 mg/hr and continue q 30  min boluses  3.  Anxiety:  Continue prn ativan  4.  Less rhonchi.  DC Lasix. Use prn   Disposition : likely hospital death.   Total time 25 min  Daniel Zupko L. Ladona Ridgel, MD MBA The Palliative Medicine Team at [facility] Phone: [phone] Pager: [phone]

## 2012-05-10 NOTE — Progress Notes (Signed)
Chaplain Note:  Chaplain visited with pt and pt's family.  Pt was resting in bed.  He was very somnolent and did not communicate during this visit.  Pt's wife, sister, and brother-in-law were at bedside supporting pt.  Chaplain provided spiritual comfort, support, and prayer for pt and pt's family.  Pt's family expressed appreciation for chaplain support.  Chaplain will follow up as needed.  05/10/12 0700  Clinical Encounter Type  Visited With Patient and family together  Visit Type Follow-up;Spiritual support  Referral From Palliative care team  Consult/Referral To Chaplain  Spiritual Encounters  Spiritual Needs Prayer;Emotional;Grief support  Stress Factors  Patient Stress Factors Exhausted;Health changes  Family Stress Factors Exhausted;Loss;Major life changes  Verdie Shire, [place] [phone]

## 2012-05-11 ENCOUNTER — Ambulatory Visit: Payer: Medicare Other

## 2012-05-11 ENCOUNTER — Ambulatory Visit: Admitting: Oncology

## 2012-05-11 MED ORDER — FUROSEMIDE 10 MG/ML IJ SOLN
20.0000 mg | Freq: Once | INTRAMUSCULAR | Status: AC
Start: 2012-05-11 — End: 2012-05-11
  Administered 2012-05-11: 20 mg via INTRAVENOUS
  Filled 2012-05-11: qty 2

## 2012-05-11 NOTE — Progress Notes (Addendum)
Room 6704 - [name] - [facility] RN Visit-R.Cobie Leidner RN  Related admission to [facility] diagnosis of pancreatic cancer.   Pt is DNR code.    Pt obtunded, lying in bed, appears comfortable, on non-rebreather mask.  Sister from [place] at bedside.  Wife is on her way up to hospital for today.  Offered support and to contact [facility] with any hospice needs.  Comfort cart in place in room.   Patient's home medication list is on shadow chart.   Please call [facility] @ [phone]- ask for RN Liaison or after hours,ask for on-call RN with any hospice needs.   Thank you.  Joneen Boers, RN  [facility]  Hospice Liaison

## 2012-05-11 NOTE — Progress Notes (Signed)
1854: patient with no pulse or bp - family at bedside.  The rn connetta gill and myself verified patient's lack of bp and pulse.  md made aware - awaiting wife to be told by a family member (wife is not here at present).  Renato Gails called and support given to family.

## 2012-05-11 NOTE — Progress Notes (Signed)
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 If patient stabilizes and is discharged to [facility] I will be glad to serve as his Hospice MD.   Please let me know if I can be of further help. Otherwise will provide support to patient and family as opportunity permits. Thank you for your help to [name]!

## 2012-05-11 NOTE — Progress Notes (Signed)
Nutrition Brief Note  Chart reviewed. Pt now transitioning to comfort care.  No further nutrition interventions warranted at this time.  Please re-consult as needed.   Jazia Faraci MS, RD, LDN Pager: [phone] After-hours pager: [phone]    

## 2012-05-11 NOTE — Progress Notes (Signed)
Utilization review completed.  

## 2012-05-11 NOTE — Progress Notes (Signed)
PROGRESS NOTE  Ac Colan WUJ:[id number] DOB: March 15, 1950 DOA: 05/29/2012 PCP: Aida Puffer, MD  Brief narrative: 62 year old male with metastatic pancreatic cancer recently discharged from [facility] long hospital was discharged on home hospice and returned hospital with failure to thrive increasing pain and progressive weakness and fatigue     Subjective  More somnolent.  Not really taking PO today.  Sleepier per family. Wife expresses grief appropriately and shock at what has transpired Family concerned he looks a little swollen Chaplain has been into see patient   Objective    Interim History: None  Telemetry: None  Objective: Filed Vitals:   05/07/2012 2131 05/10/12 0557 05/10/12 1000 06/06/2012 0527  BP:  83/47 98/45 97/43   Pulse:  82 74 72  Temp:  97.3 F (36.3 C) 98 F (36.7 C) 98 F (36.7 C)  TempSrc:  Oral Oral Axillary  Resp:  25 22 24   Height:      Weight:      SpO2: 90% 96% 88% 91%    Intake/Output Summary (Last 24 hours) at 05/28/2012 1225 Last data filed at 05/19/2012 0533  Gross per 24 hour  Intake      0 ml  Output    200 ml  Net   -200 ml    Exam:  General: Morbidly obese, orange colored skin Cardiovascular: S1-S2 no murmur rub or Respiratory: Clinically clear Abdomen: Obese slightly painful over her right upper quadrant and Skin yellow Neuro sleepy.  Moves grossly his limbs  Data Reviewed: Basic Metabolic Panel:  Recent Labs Lab 05/23/2012 0250 05/28/2012 0607  NA 132* 131*  K 2.5* 3.0*  CL 88* 91*  CO2 22 25  GLUCOSE 180* 185*  BUN 38* 38*  CREATININE 1.60* 1.52*  CALCIUM 8.7 8.4   Liver Function Tests:  Recent Labs Lab 05/16/2012 0250  AST 272*  ALT 100*  ALKPHOS 382*  BILITOT 29.7*  PROT 5.2*  ALBUMIN 2.1*   No results found for this basename: LIPASE, AMYLASE,  in the last 168 hours No results found for this basename: AMMONIA,  in the last 168 hours CBC:  Recent Labs Lab 05/17/2012 0250 05/26/2012 0607  WBC 14.7* 15.5*   NEUTROABS 12.2*  --   HGB 12.5* 11.9*  HCT 35.7* 34.5*  MCV 83.6 83.7  PLT 127* 124*   Cardiac Enzymes: No results found for this basename: CKTOTAL, CKMB, CKMBINDEX, TROPONINI,  in the last 168 hours BNP: No components found with this basename: POCBNP,  CBG: No results found for this basename: GLUCAP,  in the last 168 hours  No results found for this or any previous visit (from the past 240 hour(s)).   Studies:              All Imaging reviewed and is as per above notation   Scheduled Meds: . albuterol  2.5 mg Nebulization Q6H   Continuous Infusions: . sodium chloride 250 mL (05/19/2012 0937)  . morphine 1.5 mg/hr (05/10/12 1741)     Assessment/Plan: 1. Patient with metastatic pancreatic cancer with exceedingly poor prognosis-patient is on currently low-dose morphine drip and Ativan. Family members multiple at bedside. Appreciate palliative medicine assistance and care I suspect his overall prognosis very poor and this will be Hospital demise.  Will allow comfort feeds with understanding he might aspirate-will give a dose of lasix  Code Status: Comfort Family Communication: Discussed with wife at bedside Disposition Plan: Inpatient   Pleas Koch, MD  [facility] Pager [phone] 05/18/2012, 12:25 PM    LOS:  2 days

## 2012-05-19 ENCOUNTER — Other Ambulatory Visit: Payer: Self-pay | Admitting: Internal Medicine

## 2012-05-20 NOTE — Discharge Summary (Signed)
Death Summary  [name] [id number] DOB: 06-25-1950 DOA: May 10, 2012  PCP: Aida Puffer, MD  PCP/Office notified: no  Admit date: 05/10/12 Date of Death: 05/21/2012  Final Diagnoses:  Principal Problem:   Pancreatic cancer metastasized to liver Active Problems:   Diabetes mellitus without complication   COPD (chronic obstructive pulmonary disease)   Abdominal pain   Jaundice   Hypokalemia   Lactic acidosis    1. Metastatic pancreatic cancer   Hospital Course:  62 year old Caucasian male recently discharged 04/25/2012 with metastatic pancreatic cancer with home hospice return to the emergency room 2012-05-10 with increasing right upper quadrant/epigastric pain tearing and radiating worse with food better with bowel rest Patient had initial bilirubin of 29 BUN/creatinine 30/1.6 and alkaline phosphatase 382, AST 272, ALT 100 and venous lactic acid of 6.1. Discussion was undertaken with patient and family and patient was made full comfort care and palliative medicine was involved to help transitioned from home to freestanding hospice Patient was kept on usual therapies for hospice inclusive of morphine, Ativan and nasal oxygen but unfortunately his clinical state declined rapidly and patient was pronounced as deceased 05/19/2012 at18:54.   Time: 10  SignedRhetta Mura  [facility] 2012-05-21, 4:12 PM

## 2012-06-07 NOTE — Progress Notes (Signed)
Patient [name]      DOB: [birth year]      [id number]   Palliative Medicine Team at [facility] Progress Note    Subjective:  Daniel is unresponsive to tactile and verbal stimuli.  He did start to vocalize slightly when I spoke with him.  He remains near death. Spoke with his sister and mom about the family conflicts.  [name] is a comfortable as he will be.  Prognosis remains hours to day     Filed Vitals:   06/02/2012 0527  BP: 97/43  Pulse: 72  Temp: 98 F (36.7 C)  Resp: 24   Physical exam: General: jaundiced Pupils not examined Chest decreased clear anteriorly CVS: regular distant Abd: morbidly obese Ext: mottled feet Neuro: not responsive to tactile stimuli  Assessment and plan: 62 yr old male with metastatic pancreatic cancer.  Patient is actively dying, likely hours as he is starting to mottle.   1.  DNR/ DNI  2.  Dyspnea: continue morphine drip, with prn boluses  3.  Anxiety:  Continue prn ativan IV  Disposition:  Hospital death.   Total Time 30 min  Daniel Vitali L. Ladona Ridgel, MD MBA The Palliative Medicine Team at [facility] Phone: [phone] Pager: [phone]

## 2012-06-07 DEATH — deceased

## 2012-06-22 ENCOUNTER — Telehealth: Payer: Self-pay | Admitting: *Deleted

## 2013-12-18 NOTE — Telephone Encounter (Signed)
No entry 

## 2014-03-28 IMAGING — US US BIOPSY
1 series · 6 of 6 positions shown · non-contrast
Comparison: CT 04/20/2012

CLINICAL DATA: Pancreatic mass with multiple liver lesions

ULTRASOUND-GUIDED CORE LIVER LESION BIOPSY

[Series 1: us biopsy · 0.17mm/px · 6 of 6 slices shown]
[im 1/6]
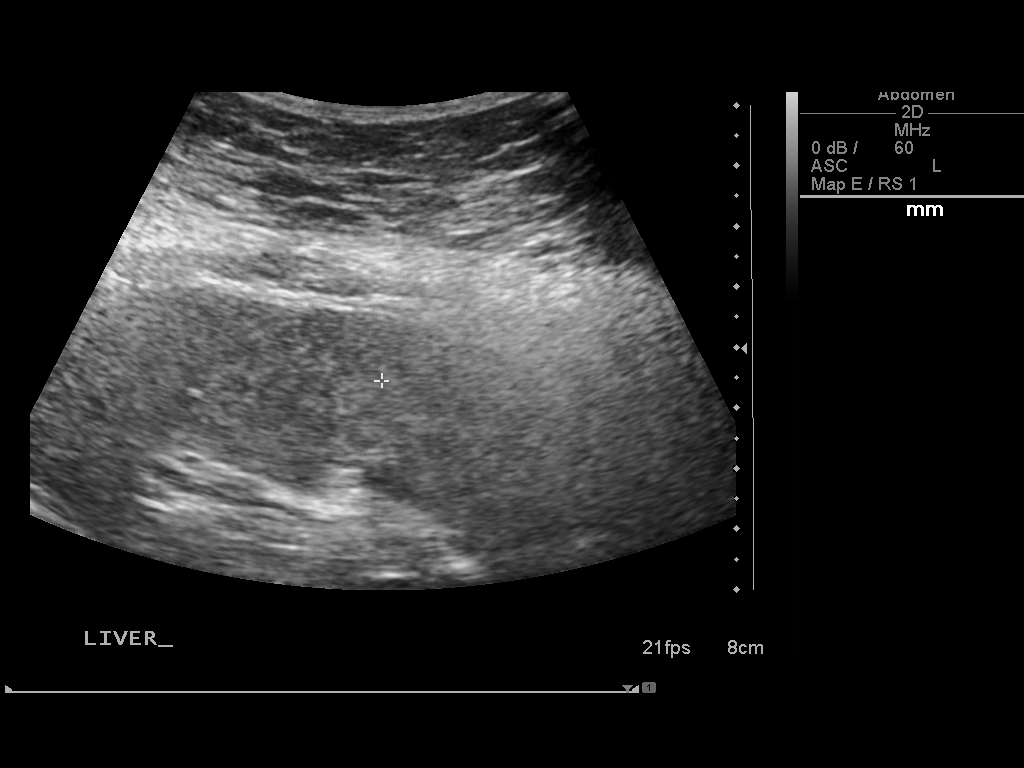
[im 2/6]
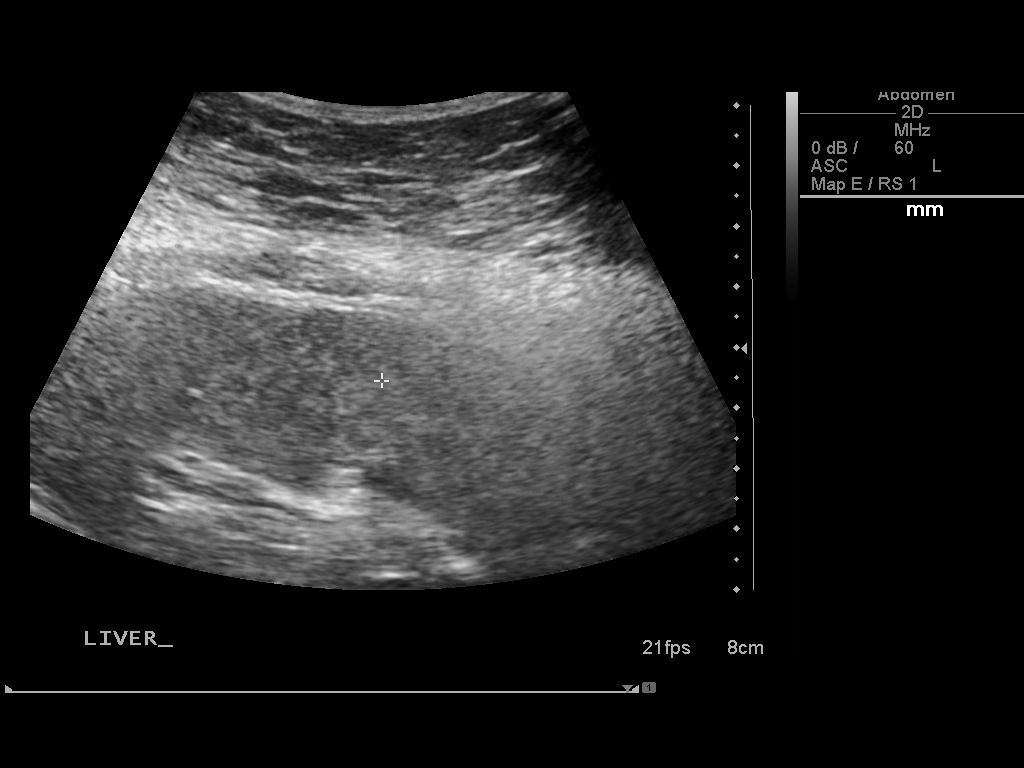
[im 3/6]
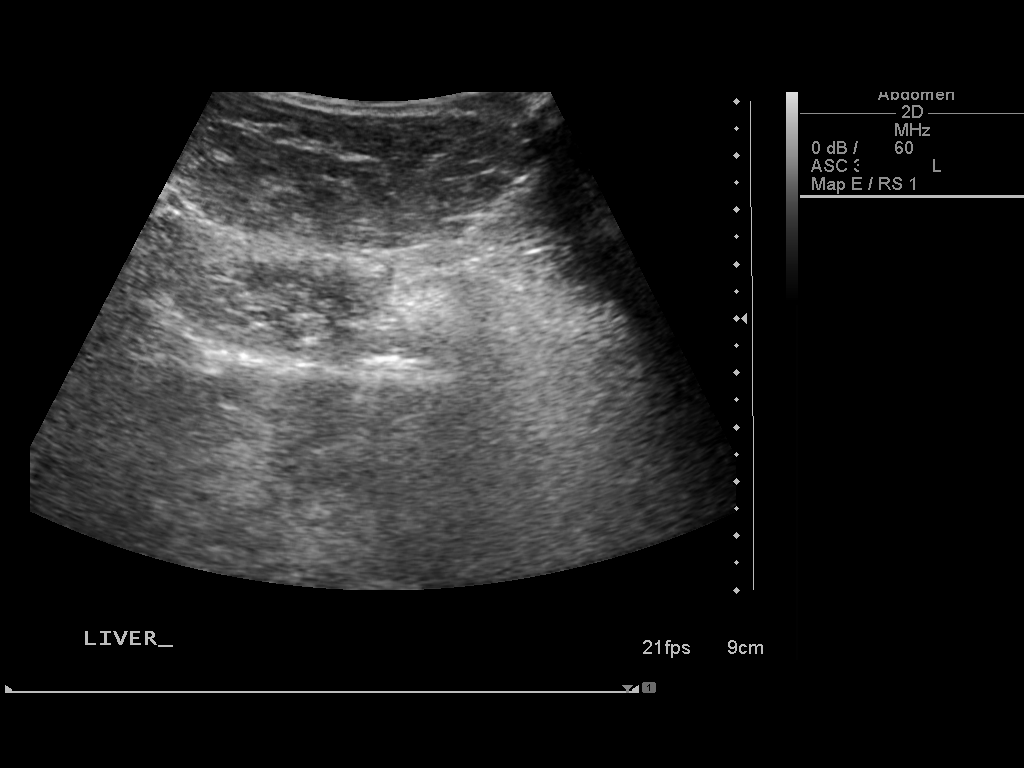
[im 4/6]
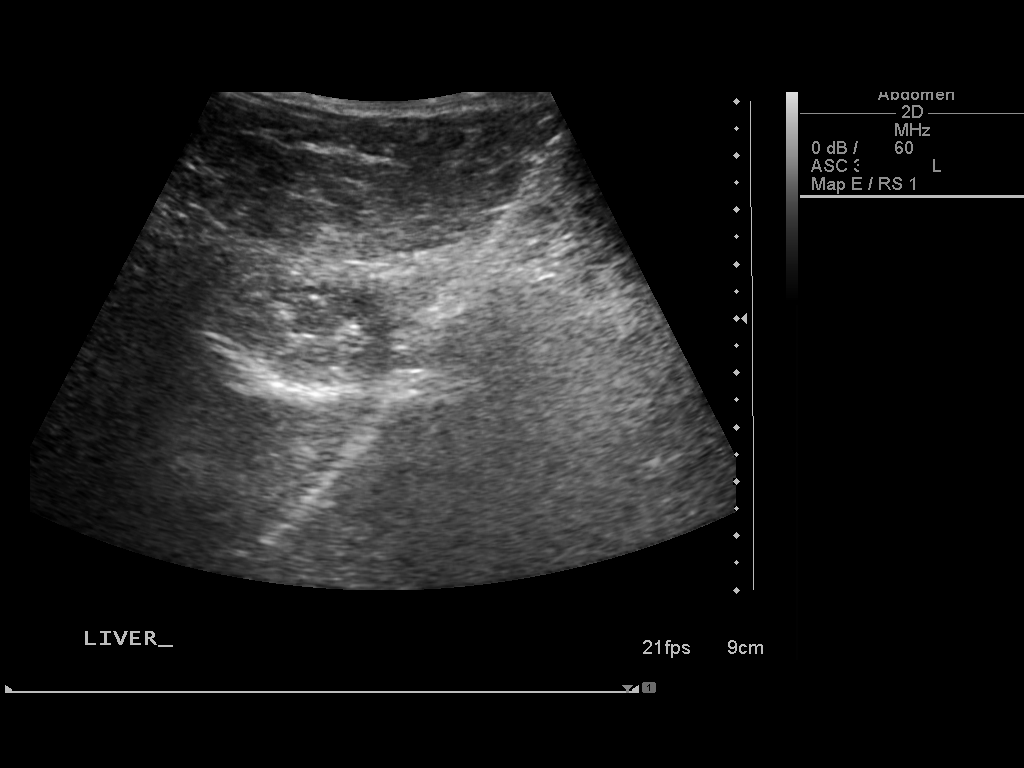
[im 5/6]
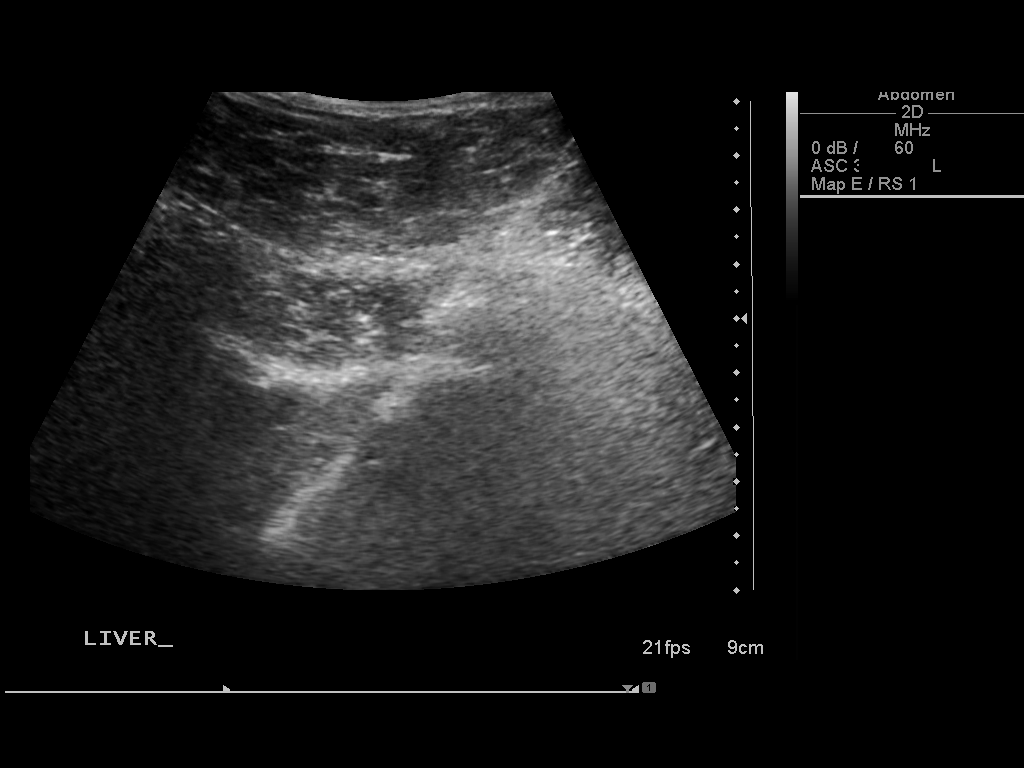
[im 6/6]
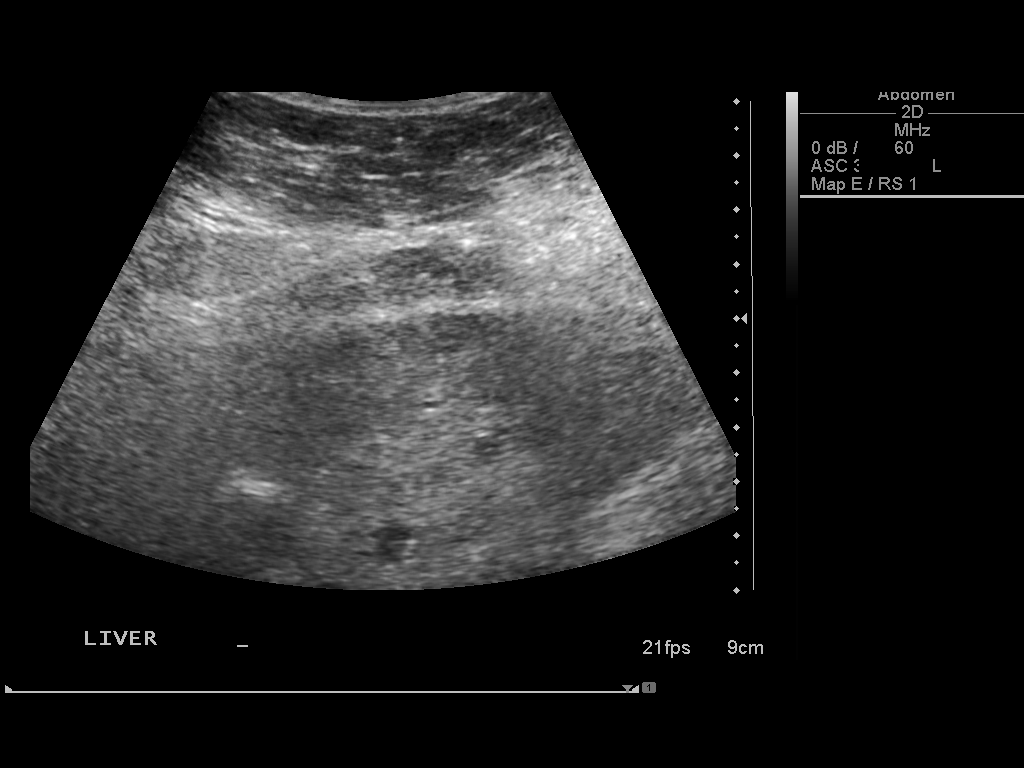

[6 of 6 positions shown; findings below may reference images not displayed]

Technique and findings: The procedure, risks (including but not
limited to bleeding, infection, organ damage), benefits, and
alternatives were explained to the patient.  Questions regarding
the procedure were encouraged and answered.  The patient
understands and consents to the procedure.Survey ultrasound of the
liver was performed and a representative left lobe lesion was
localized.  An appropriate skin entry site was marked, Operator
donned sterile gloves and mask.   Site was marked, prepped with
Betadine, draped in usual sterile fashion, infiltrated locally with
1% lidocaine.

Intravenous Fentanyl and Versed were administered as conscious
sedation during continuous cardiorespiratory monitoring by the
radiology RN, with a total moderate sedation time of 10 minutes.

Under real time ultrasound guidance, a 17 gauge trocar needle was
advanced to the margin of the lesion.  Once needle tip position was
confirmed, coaxial 18-gauge core biopsy samples were obtained,
submitted in formalin to  surgical pathology.  The guide needle was
removed.  Postprocedure scans show no hematoma or other apparent
complication. The patient tolerated the procedure well.
IMPRESSION: 1.  Technically successful ultrasound-guided core biopsy of   liver
lesion
